# Patient Record
Sex: Female | Born: 1950 | Race: Black or African American | Hispanic: No | Marital: Married | Smoking: Never smoker
Health system: Southern US, Community
[De-identification: ages and names within clinical notes are randomized; demographics above are authoritative.]

## PROBLEM LIST (undated history)

## (undated) DIAGNOSIS — G4700 Insomnia, unspecified: Secondary | ICD-10-CM

## (undated) DIAGNOSIS — M2550 Pain in unspecified joint: Secondary | ICD-10-CM

## (undated) DIAGNOSIS — I10 Essential (primary) hypertension: Secondary | ICD-10-CM

## (undated) DIAGNOSIS — R1313 Dysphagia, pharyngeal phase: Secondary | ICD-10-CM

## (undated) DIAGNOSIS — E876 Hypokalemia: Secondary | ICD-10-CM

## (undated) DIAGNOSIS — J309 Allergic rhinitis, unspecified: Secondary | ICD-10-CM

## (undated) DIAGNOSIS — N3281 Overactive bladder: Secondary | ICD-10-CM

## (undated) DIAGNOSIS — E040 Nontoxic diffuse goiter: Secondary | ICD-10-CM

## (undated) DIAGNOSIS — R413 Other amnesia: Secondary | ICD-10-CM

## (undated) DIAGNOSIS — E785 Hyperlipidemia, unspecified: Secondary | ICD-10-CM

## (undated) HISTORY — DX: Other amnesia: R41.3

## (undated) HISTORY — DX: Allergic rhinitis, unspecified: J30.9

## (undated) HISTORY — DX: Overactive bladder: N32.81

## (undated) HISTORY — DX: Hypokalemia: E87.6

## (undated) HISTORY — DX: Hyperlipidemia, unspecified: E78.5

## (undated) HISTORY — DX: Essential (primary) hypertension: I10

## (undated) HISTORY — DX: Dysphagia, pharyngeal phase: R13.13

## (undated) HISTORY — PX: TOTAL VAGINAL HYSTERECTOMY: SHX2548

## (undated) HISTORY — DX: Nontoxic diffuse goiter: E04.0

## (undated) HISTORY — DX: Pain in unspecified joint: M25.50

## (undated) HISTORY — DX: Insomnia, unspecified: G47.00

---

## 2002-10-25 ENCOUNTER — Ambulatory Visit (HOSPITAL_COMMUNITY): Admission: RE | Admit: 2002-10-25 | Discharge: 2002-10-25 | Payer: Self-pay | Admitting: *Deleted

## 2002-10-25 ENCOUNTER — Encounter: Payer: Self-pay | Admitting: *Deleted

## 2004-06-30 ENCOUNTER — Encounter: Admission: RE | Admit: 2004-06-30 | Discharge: 2004-06-30 | Payer: Self-pay | Admitting: Orthopedic Surgery

## 2004-07-09 ENCOUNTER — Encounter
Admission: RE | Admit: 2004-07-09 | Discharge: 2004-07-09 | Payer: Self-pay | Admitting: Physical Medicine and Rehabilitation

## 2004-12-24 ENCOUNTER — Ambulatory Visit: Payer: Self-pay | Admitting: Psychiatry

## 2004-12-24 ENCOUNTER — Inpatient Hospital Stay (HOSPITAL_COMMUNITY): Admission: RE | Admit: 2004-12-24 | Discharge: 2005-01-03 | Payer: Self-pay | Admitting: Psychiatry

## 2004-12-30 ENCOUNTER — Emergency Department (HOSPITAL_COMMUNITY): Admission: EM | Admit: 2004-12-30 | Discharge: 2004-12-30 | Payer: Self-pay | Admitting: Emergency Medicine

## 2005-01-05 ENCOUNTER — Encounter: Admission: RE | Admit: 2005-01-05 | Discharge: 2005-01-05 | Payer: Self-pay | Admitting: Sports Medicine

## 2005-08-11 ENCOUNTER — Ambulatory Visit (HOSPITAL_BASED_OUTPATIENT_CLINIC_OR_DEPARTMENT_OTHER): Admission: RE | Admit: 2005-08-11 | Discharge: 2005-08-12 | Payer: Self-pay | Admitting: Orthopedic Surgery

## 2006-03-21 IMAGING — CT CT EXTREM LOW W/O CM*R*
4 of 5 series · 12 of 46 positions shown, 19 images · IV contrast (agent unspecified)
Comparison: none

CLINICAL DATA: Chronic right hip pain for several years.  No injury. 
 CT RIGHT HIP W/O CONTRAST: 
 Multidetector helical scans through the right hip were performed.  The dictation from MRI from [facility] was reviewed describing a corticated 8mm right anterior femoral head cystic bony lesion.  On the present study there is a well corticated cystic lesion which is immediately subcortical in the anterior right femoral head at the subcapital line.  This has completely benign characteristics and is most consistent with a simple bone cyst.  No other bony lesion is seen.  The hip joint space appears normal with no significant degenerative change noted.  No acute bony abnormality is seen.  Axial images show no abnormality within the pelvis.  The urinary bladder is decompressed.

[Series 2: pelvis w/o · axial · non-contrast · 0.62mm/px · z∈[-259,-86]mm · 5 of 105 slices shown, 10 images]
[im 18/105  soft-tissue]
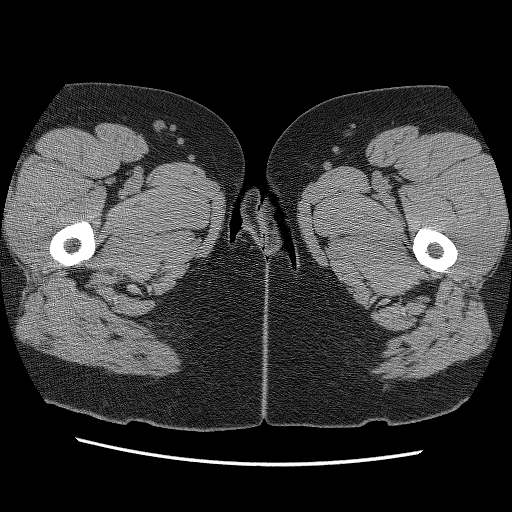
[im 18/105  bone]
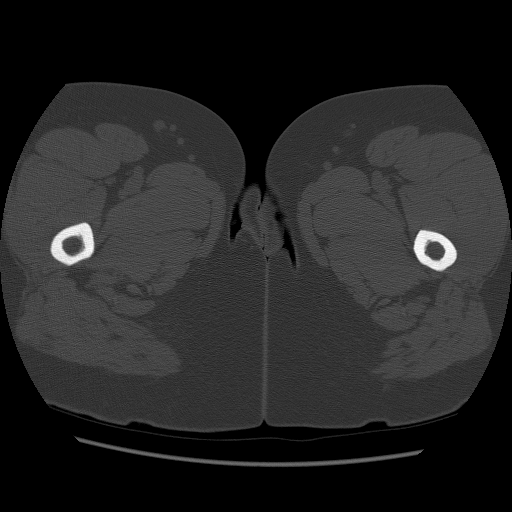
[im 35/105  soft-tissue]
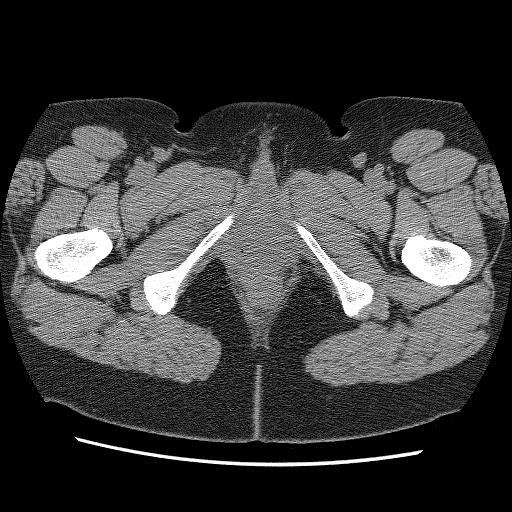
[im 35/105  lung]
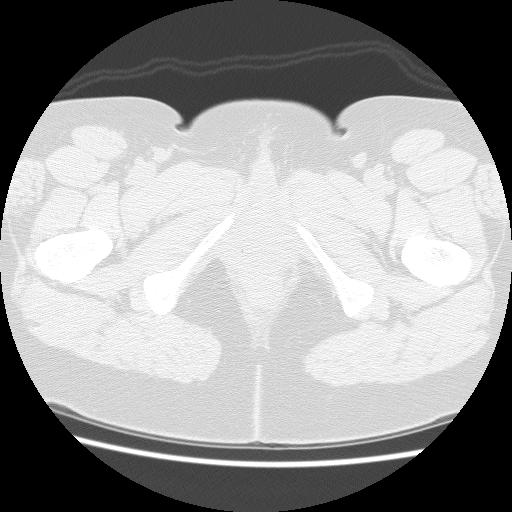
[im 53/105  soft-tissue]
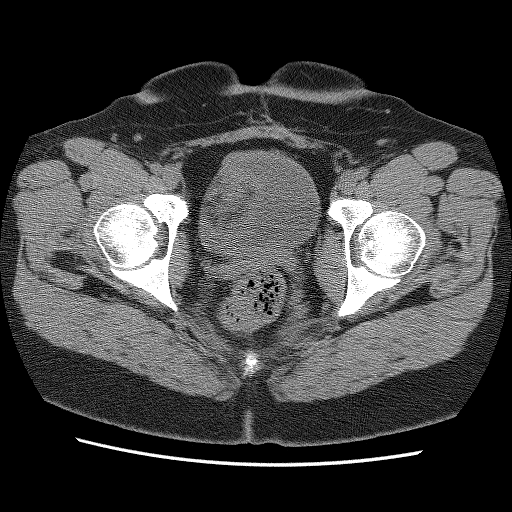
[im 53/105  lung]
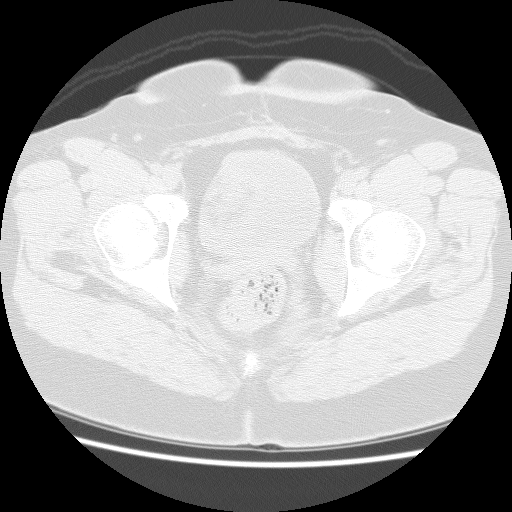
[im 70/105  soft-tissue]
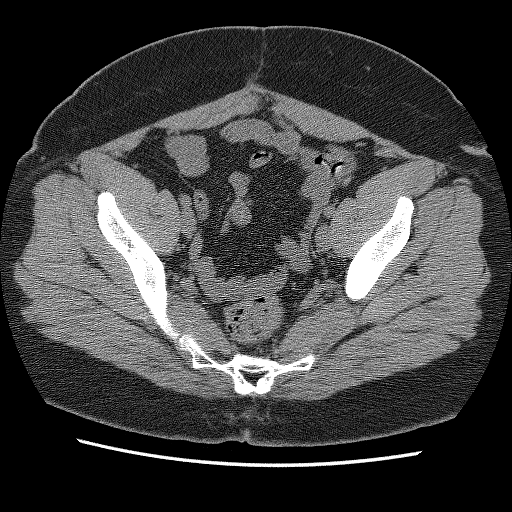
[im 70/105  lung]
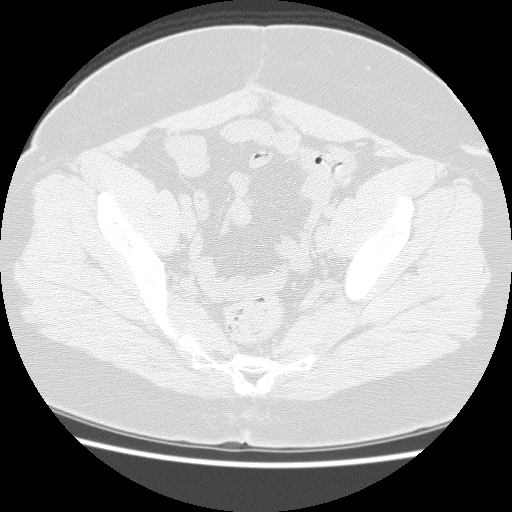
[im 87/105  soft-tissue]
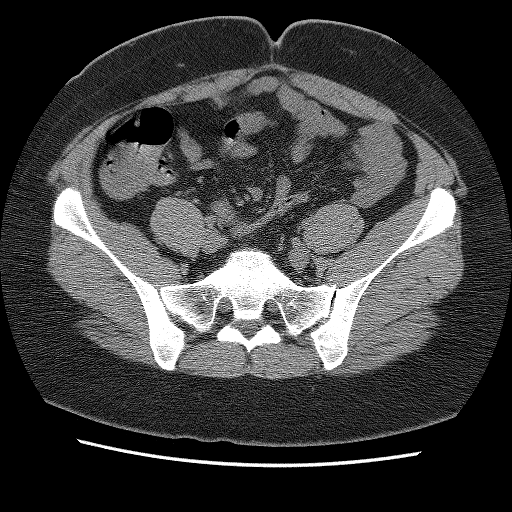
[im 87/105  lung]
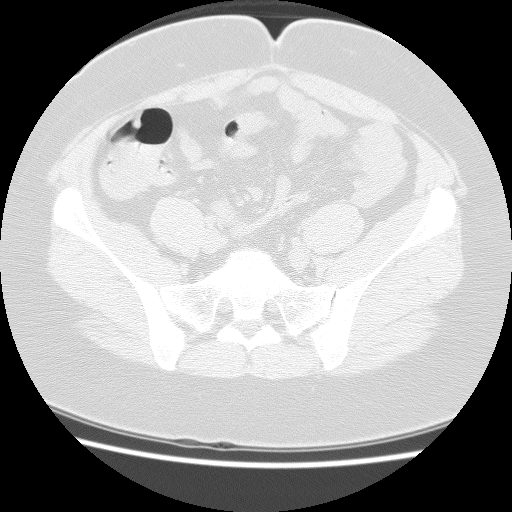

[Series 4: recon 3: pelvis w/o · axial · non-contrast · 0.29mm/px · z∈[-283,-248]mm · 3 of 279 slices shown]
[im 30/279  soft-tissue]
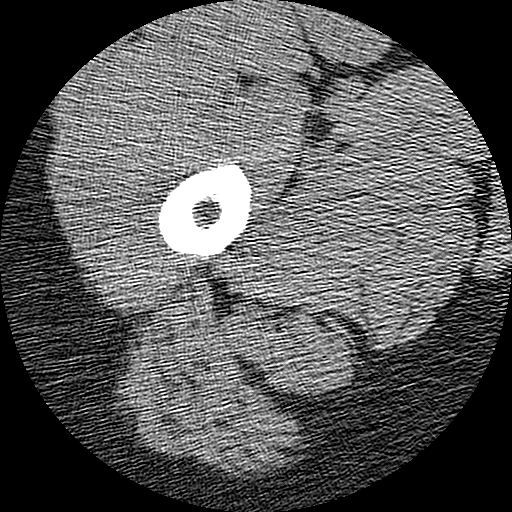
[im 59/279  soft-tissue]
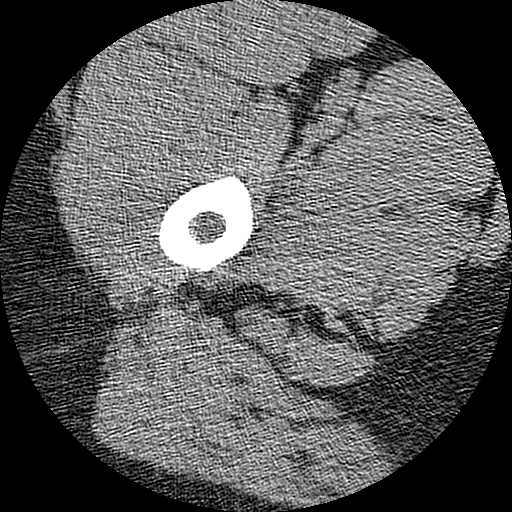
[im 88/279  soft-tissue]
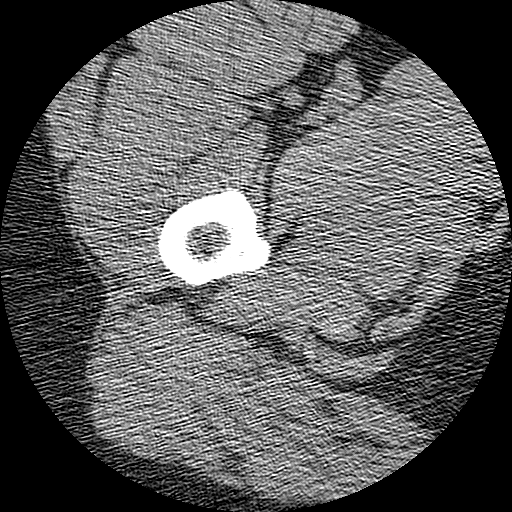

[Series 400: reformatted · sagittal · 0.33mm/px · 3 of 40 slices shown, 4 images (1 of 2)]
[im 14/40  soft-tissue]
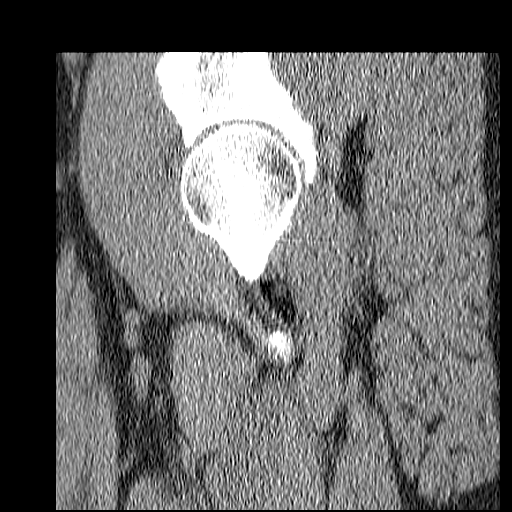
[im 18/40  soft-tissue]
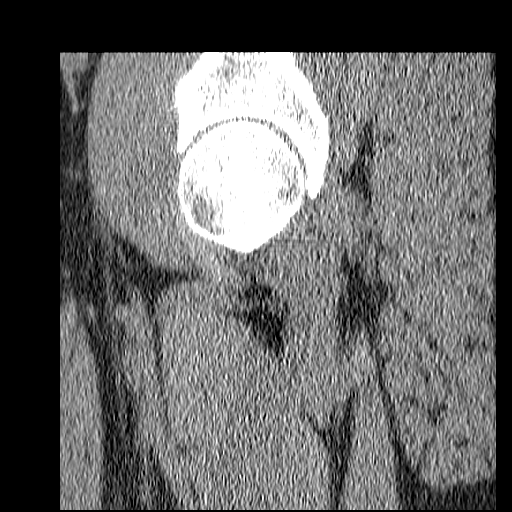
[im 18/40  bone]
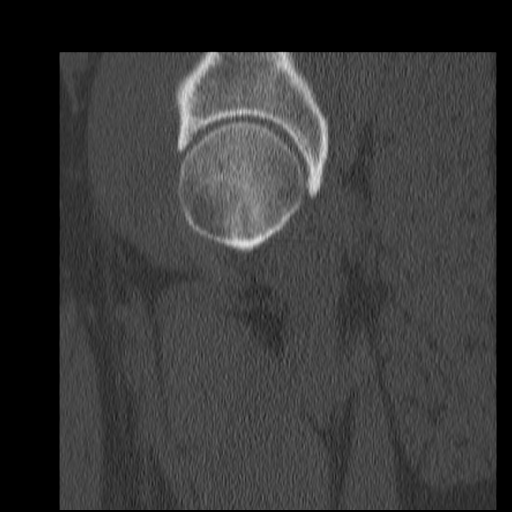
[im 22/40  soft-tissue]
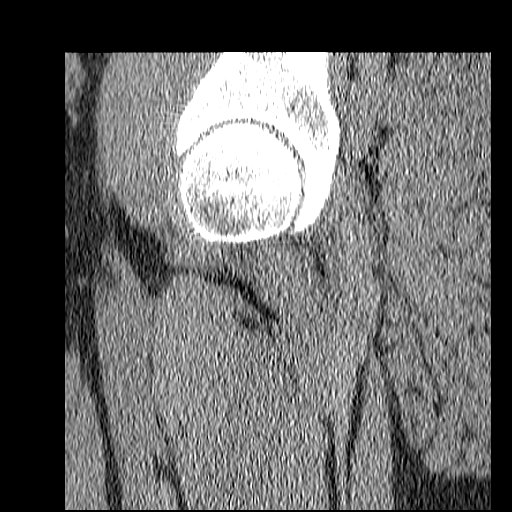

[Series 401: reformatted · coronal · 0.33mm/px · 1 of 40 slices shown, 2 images (2 of 2)]
[im 14/40  soft-tissue]
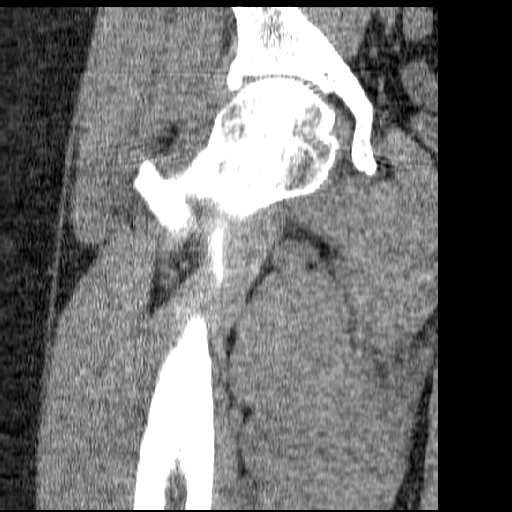
[im 14/40  bone]
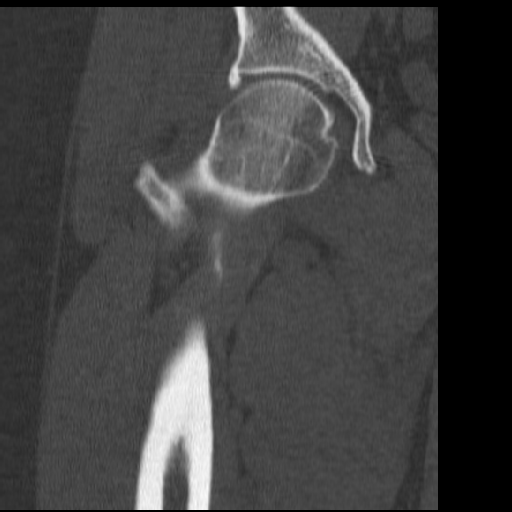

[12 of 46 positions shown; findings below may reference images not displayed]

IMPRESSION: Well corticated 8mm right subcapital femoral cystic lesion consistent with benign process such as simple bone cyst.  No other abnormality is seen.

## 2008-05-15 IMAGING — US US-BREAST([ID])
1 series · 4 of 4 positions shown · non-contrast
Comparison: NONE

CLINICAL DATA: Follow-up mammogram. 

RIGHT BREAST ULTRASOUND

[Series 1: us breast · 0.08mm/px · 4 of 4 slices shown]
[im 1/4]
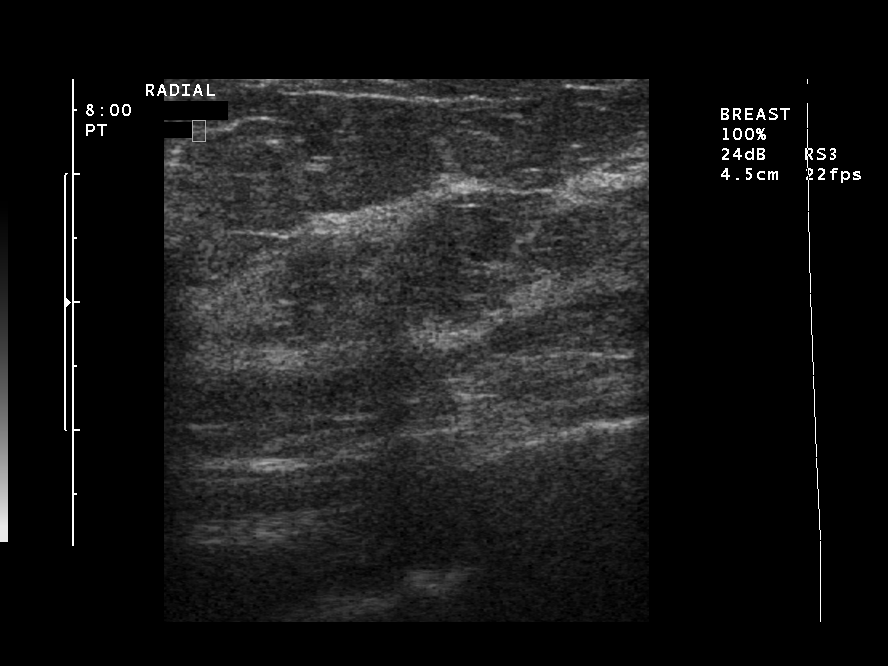
[im 2/4]
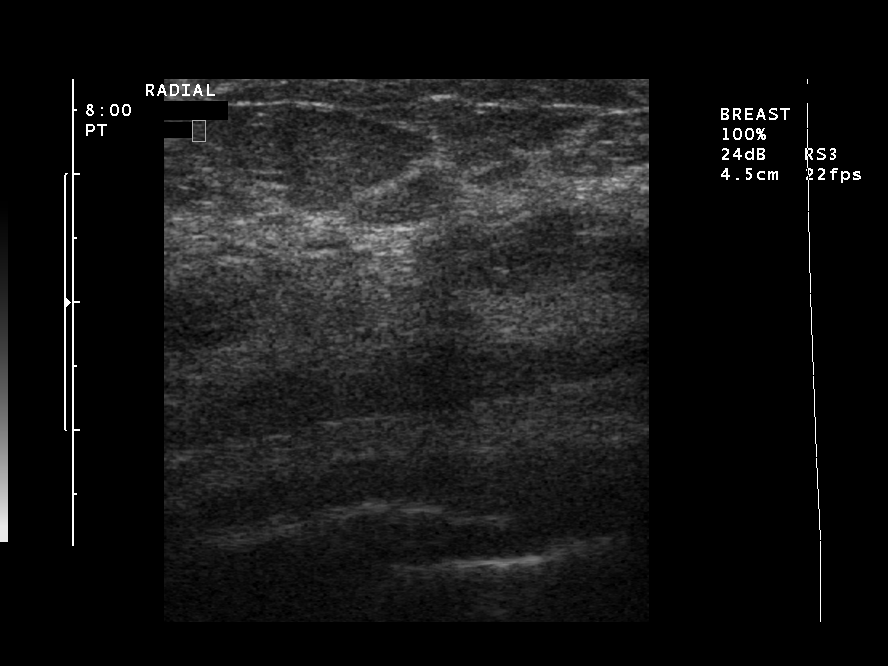
[im 3/4]
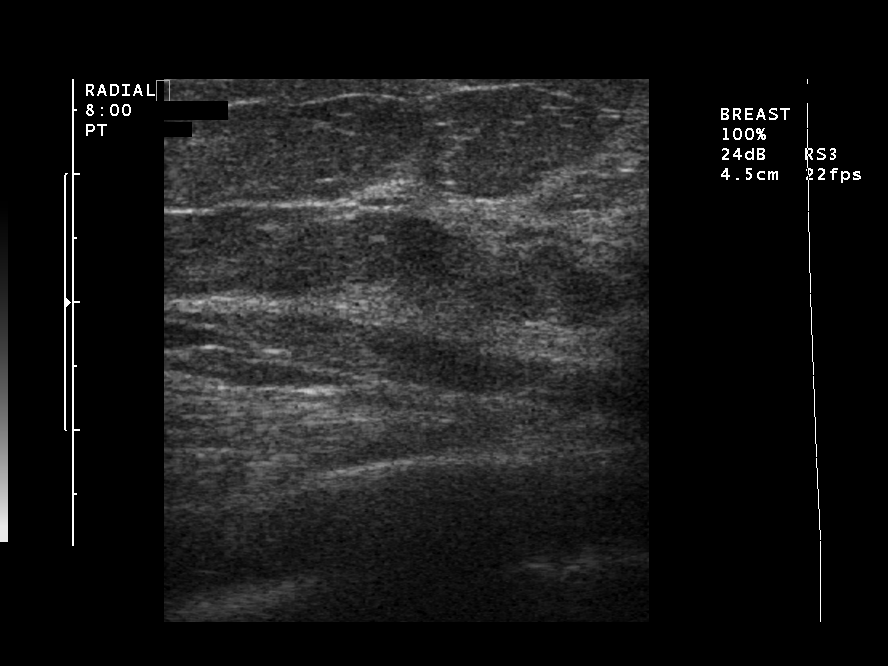
[im 4/4]
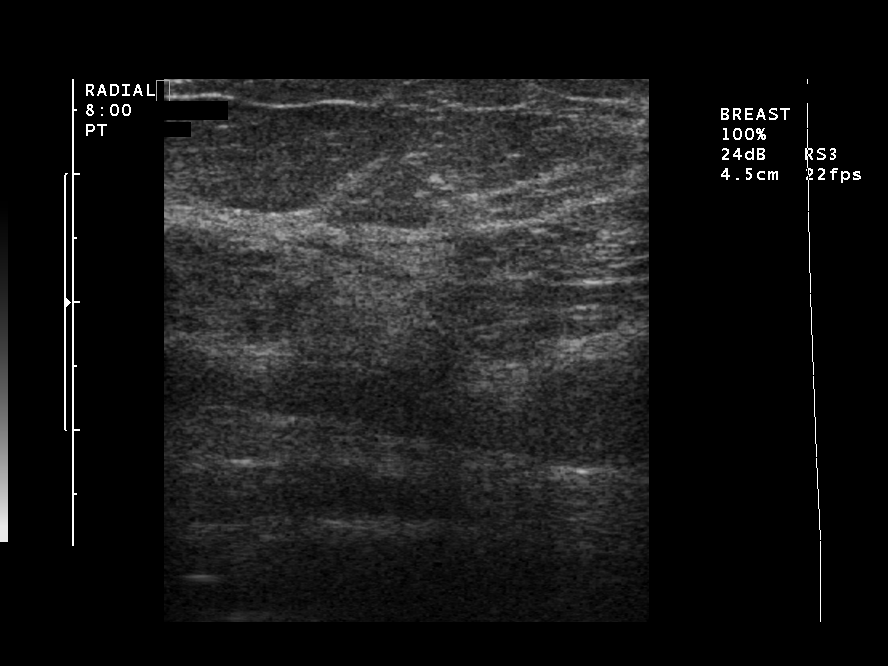

[4 of 4 positions shown; findings below may reference images not displayed]

FINDINGS: The patient reports tenderness and a lump in the right 
breast.  Ultrasound was performed of this region.  This area is 
located at the [DATE] position, 7 cm from the nipple.  There is no 
solid or cystic mass.  No abnormal acoustic attenuation.
IMPRESSION: No ultrasonographic evidence of malignancy. BI-RADS 
03/05/2007 Dict Date: 03/02/2007  Trans Date: 03/05/2007 JH  SHIMPEI

## 2015-10-16 DIAGNOSIS — M545 Low back pain: Secondary | ICD-10-CM | POA: Diagnosis not present

## 2015-10-16 DIAGNOSIS — M25521 Pain in right elbow: Secondary | ICD-10-CM | POA: Diagnosis not present

## 2015-10-23 DIAGNOSIS — Z1231 Encounter for screening mammogram for malignant neoplasm of breast: Secondary | ICD-10-CM | POA: Diagnosis not present

## 2015-11-19 DIAGNOSIS — M79632 Pain in left forearm: Secondary | ICD-10-CM | POA: Diagnosis not present

## 2016-02-12 DIAGNOSIS — J0100 Acute maxillary sinusitis, unspecified: Secondary | ICD-10-CM | POA: Diagnosis not present

## 2016-02-12 DIAGNOSIS — M545 Low back pain: Secondary | ICD-10-CM | POA: Diagnosis not present

## 2016-03-01 DIAGNOSIS — M6281 Muscle weakness (generalized): Secondary | ICD-10-CM | POA: Diagnosis not present

## 2016-03-01 DIAGNOSIS — M545 Low back pain: Secondary | ICD-10-CM | POA: Diagnosis not present

## 2016-03-01 DIAGNOSIS — R2689 Other abnormalities of gait and mobility: Secondary | ICD-10-CM | POA: Diagnosis not present

## 2016-03-10 DIAGNOSIS — M6281 Muscle weakness (generalized): Secondary | ICD-10-CM | POA: Diagnosis not present

## 2016-03-10 DIAGNOSIS — R2689 Other abnormalities of gait and mobility: Secondary | ICD-10-CM | POA: Diagnosis not present

## 2016-03-10 DIAGNOSIS — M545 Low back pain: Secondary | ICD-10-CM | POA: Diagnosis not present

## 2016-03-15 DIAGNOSIS — R2689 Other abnormalities of gait and mobility: Secondary | ICD-10-CM | POA: Diagnosis not present

## 2016-03-15 DIAGNOSIS — M6281 Muscle weakness (generalized): Secondary | ICD-10-CM | POA: Diagnosis not present

## 2016-03-15 DIAGNOSIS — M545 Low back pain: Secondary | ICD-10-CM | POA: Diagnosis not present

## 2016-03-22 DIAGNOSIS — M6281 Muscle weakness (generalized): Secondary | ICD-10-CM | POA: Diagnosis not present

## 2016-03-22 DIAGNOSIS — M545 Low back pain: Secondary | ICD-10-CM | POA: Diagnosis not present

## 2016-03-22 DIAGNOSIS — R2689 Other abnormalities of gait and mobility: Secondary | ICD-10-CM | POA: Diagnosis not present

## 2016-03-29 DIAGNOSIS — R2689 Other abnormalities of gait and mobility: Secondary | ICD-10-CM | POA: Diagnosis not present

## 2016-03-29 DIAGNOSIS — M6281 Muscle weakness (generalized): Secondary | ICD-10-CM | POA: Diagnosis not present

## 2016-03-29 DIAGNOSIS — M545 Low back pain: Secondary | ICD-10-CM | POA: Diagnosis not present

## 2016-04-05 DIAGNOSIS — M545 Low back pain: Secondary | ICD-10-CM | POA: Diagnosis not present

## 2016-04-05 DIAGNOSIS — M6281 Muscle weakness (generalized): Secondary | ICD-10-CM | POA: Diagnosis not present

## 2016-04-05 DIAGNOSIS — R2689 Other abnormalities of gait and mobility: Secondary | ICD-10-CM | POA: Diagnosis not present

## 2016-04-12 DIAGNOSIS — M6281 Muscle weakness (generalized): Secondary | ICD-10-CM | POA: Diagnosis not present

## 2016-04-12 DIAGNOSIS — M545 Low back pain: Secondary | ICD-10-CM | POA: Diagnosis not present

## 2016-04-12 DIAGNOSIS — R2689 Other abnormalities of gait and mobility: Secondary | ICD-10-CM | POA: Diagnosis not present

## 2016-04-19 DIAGNOSIS — M6281 Muscle weakness (generalized): Secondary | ICD-10-CM | POA: Diagnosis not present

## 2016-04-19 DIAGNOSIS — R2689 Other abnormalities of gait and mobility: Secondary | ICD-10-CM | POA: Diagnosis not present

## 2016-04-19 DIAGNOSIS — M545 Low back pain: Secondary | ICD-10-CM | POA: Diagnosis not present

## 2016-05-03 DIAGNOSIS — M6281 Muscle weakness (generalized): Secondary | ICD-10-CM | POA: Diagnosis not present

## 2016-05-03 DIAGNOSIS — M545 Low back pain: Secondary | ICD-10-CM | POA: Diagnosis not present

## 2016-05-03 DIAGNOSIS — R2689 Other abnormalities of gait and mobility: Secondary | ICD-10-CM | POA: Diagnosis not present

## 2016-05-10 DIAGNOSIS — R2689 Other abnormalities of gait and mobility: Secondary | ICD-10-CM | POA: Diagnosis not present

## 2016-05-10 DIAGNOSIS — M545 Low back pain: Secondary | ICD-10-CM | POA: Diagnosis not present

## 2016-05-10 DIAGNOSIS — M6281 Muscle weakness (generalized): Secondary | ICD-10-CM | POA: Diagnosis not present

## 2016-05-17 DIAGNOSIS — M545 Low back pain: Secondary | ICD-10-CM | POA: Diagnosis not present

## 2016-05-17 DIAGNOSIS — M6281 Muscle weakness (generalized): Secondary | ICD-10-CM | POA: Diagnosis not present

## 2016-05-17 DIAGNOSIS — R2689 Other abnormalities of gait and mobility: Secondary | ICD-10-CM | POA: Diagnosis not present

## 2016-05-24 DIAGNOSIS — M6281 Muscle weakness (generalized): Secondary | ICD-10-CM | POA: Diagnosis not present

## 2016-05-24 DIAGNOSIS — R2689 Other abnormalities of gait and mobility: Secondary | ICD-10-CM | POA: Diagnosis not present

## 2016-05-24 DIAGNOSIS — M545 Low back pain: Secondary | ICD-10-CM | POA: Diagnosis not present

## 2016-07-21 DIAGNOSIS — J40 Bronchitis, not specified as acute or chronic: Secondary | ICD-10-CM | POA: Diagnosis not present

## 2016-07-21 DIAGNOSIS — J329 Chronic sinusitis, unspecified: Secondary | ICD-10-CM | POA: Diagnosis not present

## 2016-08-11 DIAGNOSIS — N390 Urinary tract infection, site not specified: Secondary | ICD-10-CM | POA: Diagnosis not present

## 2016-08-11 DIAGNOSIS — Z760 Encounter for issue of repeat prescription: Secondary | ICD-10-CM | POA: Diagnosis not present

## 2016-09-08 DIAGNOSIS — N390 Urinary tract infection, site not specified: Secondary | ICD-10-CM | POA: Diagnosis not present

## 2016-10-11 DIAGNOSIS — R69 Illness, unspecified: Secondary | ICD-10-CM | POA: Diagnosis not present

## 2016-10-27 DIAGNOSIS — K219 Gastro-esophageal reflux disease without esophagitis: Secondary | ICD-10-CM | POA: Diagnosis not present

## 2016-10-27 DIAGNOSIS — I10 Essential (primary) hypertension: Secondary | ICD-10-CM | POA: Diagnosis not present

## 2016-10-27 DIAGNOSIS — M25559 Pain in unspecified hip: Secondary | ICD-10-CM | POA: Diagnosis not present

## 2016-10-27 DIAGNOSIS — Z9181 History of falling: Secondary | ICD-10-CM | POA: Diagnosis not present

## 2016-10-27 DIAGNOSIS — Z0000 Encounter for general adult medical examination without abnormal findings: Secondary | ICD-10-CM | POA: Diagnosis not present

## 2016-10-27 DIAGNOSIS — Z6829 Body mass index (BMI) 29.0-29.9, adult: Secondary | ICD-10-CM | POA: Diagnosis not present

## 2016-10-27 DIAGNOSIS — Z1389 Encounter for screening for other disorder: Secondary | ICD-10-CM | POA: Diagnosis not present

## 2016-11-24 DIAGNOSIS — Z1231 Encounter for screening mammogram for malignant neoplasm of breast: Secondary | ICD-10-CM | POA: Diagnosis not present

## 2017-01-19 DIAGNOSIS — Z23 Encounter for immunization: Secondary | ICD-10-CM | POA: Diagnosis not present

## 2017-01-19 DIAGNOSIS — E876 Hypokalemia: Secondary | ICD-10-CM | POA: Diagnosis not present

## 2017-01-19 DIAGNOSIS — I679 Cerebrovascular disease, unspecified: Secondary | ICD-10-CM | POA: Diagnosis not present

## 2017-01-20 DIAGNOSIS — E039 Hypothyroidism, unspecified: Secondary | ICD-10-CM | POA: Diagnosis not present

## 2017-01-20 DIAGNOSIS — Z79899 Other long term (current) drug therapy: Secondary | ICD-10-CM | POA: Diagnosis not present

## 2017-01-24 DIAGNOSIS — Z1211 Encounter for screening for malignant neoplasm of colon: Secondary | ICD-10-CM | POA: Diagnosis not present

## 2017-02-09 DIAGNOSIS — M545 Low back pain: Secondary | ICD-10-CM | POA: Diagnosis not present

## 2017-02-09 DIAGNOSIS — R2689 Other abnormalities of gait and mobility: Secondary | ICD-10-CM | POA: Diagnosis not present

## 2017-02-09 DIAGNOSIS — M6281 Muscle weakness (generalized): Secondary | ICD-10-CM | POA: Diagnosis not present

## 2017-02-14 DIAGNOSIS — R2689 Other abnormalities of gait and mobility: Secondary | ICD-10-CM | POA: Diagnosis not present

## 2017-02-14 DIAGNOSIS — M545 Low back pain: Secondary | ICD-10-CM | POA: Diagnosis not present

## 2017-02-14 DIAGNOSIS — M6281 Muscle weakness (generalized): Secondary | ICD-10-CM | POA: Diagnosis not present

## 2017-02-16 DIAGNOSIS — R2689 Other abnormalities of gait and mobility: Secondary | ICD-10-CM | POA: Diagnosis not present

## 2017-02-16 DIAGNOSIS — M6281 Muscle weakness (generalized): Secondary | ICD-10-CM | POA: Diagnosis not present

## 2017-02-16 DIAGNOSIS — M545 Low back pain: Secondary | ICD-10-CM | POA: Diagnosis not present

## 2017-02-22 DIAGNOSIS — R2689 Other abnormalities of gait and mobility: Secondary | ICD-10-CM | POA: Diagnosis not present

## 2017-02-22 DIAGNOSIS — M6281 Muscle weakness (generalized): Secondary | ICD-10-CM | POA: Diagnosis not present

## 2017-02-22 DIAGNOSIS — M545 Low back pain: Secondary | ICD-10-CM | POA: Diagnosis not present

## 2017-03-01 DIAGNOSIS — R2689 Other abnormalities of gait and mobility: Secondary | ICD-10-CM | POA: Diagnosis not present

## 2017-03-01 DIAGNOSIS — M6281 Muscle weakness (generalized): Secondary | ICD-10-CM | POA: Diagnosis not present

## 2017-03-01 DIAGNOSIS — M545 Low back pain: Secondary | ICD-10-CM | POA: Diagnosis not present

## 2017-03-02 DIAGNOSIS — M545 Low back pain: Secondary | ICD-10-CM | POA: Diagnosis not present

## 2017-03-02 DIAGNOSIS — M6281 Muscle weakness (generalized): Secondary | ICD-10-CM | POA: Diagnosis not present

## 2017-03-02 DIAGNOSIS — R2689 Other abnormalities of gait and mobility: Secondary | ICD-10-CM | POA: Diagnosis not present

## 2017-03-06 DIAGNOSIS — R2689 Other abnormalities of gait and mobility: Secondary | ICD-10-CM | POA: Diagnosis not present

## 2017-03-06 DIAGNOSIS — M6281 Muscle weakness (generalized): Secondary | ICD-10-CM | POA: Diagnosis not present

## 2017-03-06 DIAGNOSIS — M545 Low back pain: Secondary | ICD-10-CM | POA: Diagnosis not present

## 2017-03-15 DIAGNOSIS — M545 Low back pain: Secondary | ICD-10-CM | POA: Diagnosis not present

## 2017-03-15 DIAGNOSIS — M6281 Muscle weakness (generalized): Secondary | ICD-10-CM | POA: Diagnosis not present

## 2017-03-15 DIAGNOSIS — R2689 Other abnormalities of gait and mobility: Secondary | ICD-10-CM | POA: Diagnosis not present

## 2017-03-16 DIAGNOSIS — M545 Low back pain: Secondary | ICD-10-CM | POA: Diagnosis not present

## 2017-03-16 DIAGNOSIS — R2689 Other abnormalities of gait and mobility: Secondary | ICD-10-CM | POA: Diagnosis not present

## 2017-03-16 DIAGNOSIS — M6281 Muscle weakness (generalized): Secondary | ICD-10-CM | POA: Diagnosis not present

## 2017-03-20 DIAGNOSIS — R2689 Other abnormalities of gait and mobility: Secondary | ICD-10-CM | POA: Diagnosis not present

## 2017-03-20 DIAGNOSIS — M6281 Muscle weakness (generalized): Secondary | ICD-10-CM | POA: Diagnosis not present

## 2017-03-20 DIAGNOSIS — M545 Low back pain: Secondary | ICD-10-CM | POA: Diagnosis not present

## 2017-03-22 DIAGNOSIS — M545 Low back pain: Secondary | ICD-10-CM | POA: Diagnosis not present

## 2017-03-22 DIAGNOSIS — R2689 Other abnormalities of gait and mobility: Secondary | ICD-10-CM | POA: Diagnosis not present

## 2017-03-22 DIAGNOSIS — M6281 Muscle weakness (generalized): Secondary | ICD-10-CM | POA: Diagnosis not present

## 2017-04-10 DIAGNOSIS — L810 Postinflammatory hyperpigmentation: Secondary | ICD-10-CM | POA: Diagnosis not present

## 2017-05-08 DIAGNOSIS — L810 Postinflammatory hyperpigmentation: Secondary | ICD-10-CM | POA: Diagnosis not present

## 2017-05-16 DIAGNOSIS — R69 Illness, unspecified: Secondary | ICD-10-CM | POA: Diagnosis not present

## 2017-06-09 DIAGNOSIS — Z23 Encounter for immunization: Secondary | ICD-10-CM | POA: Diagnosis not present

## 2017-11-22 DIAGNOSIS — H5203 Hypermetropia, bilateral: Secondary | ICD-10-CM | POA: Diagnosis not present

## 2017-11-30 DIAGNOSIS — N3000 Acute cystitis without hematuria: Secondary | ICD-10-CM | POA: Diagnosis not present

## 2017-11-30 DIAGNOSIS — Z6829 Body mass index (BMI) 29.0-29.9, adult: Secondary | ICD-10-CM | POA: Diagnosis not present

## 2017-12-06 DIAGNOSIS — Z1231 Encounter for screening mammogram for malignant neoplasm of breast: Secondary | ICD-10-CM | POA: Diagnosis not present

## 2018-02-28 DIAGNOSIS — J069 Acute upper respiratory infection, unspecified: Secondary | ICD-10-CM | POA: Diagnosis not present

## 2018-03-30 DIAGNOSIS — Z0000 Encounter for general adult medical examination without abnormal findings: Secondary | ICD-10-CM | POA: Diagnosis not present

## 2018-03-30 DIAGNOSIS — R159 Full incontinence of feces: Secondary | ICD-10-CM | POA: Diagnosis not present

## 2018-03-30 DIAGNOSIS — Z6828 Body mass index (BMI) 28.0-28.9, adult: Secondary | ICD-10-CM | POA: Diagnosis not present

## 2018-03-30 DIAGNOSIS — Z1339 Encounter for screening examination for other mental health and behavioral disorders: Secondary | ICD-10-CM | POA: Diagnosis not present

## 2018-03-30 DIAGNOSIS — G4700 Insomnia, unspecified: Secondary | ICD-10-CM | POA: Diagnosis not present

## 2018-03-30 DIAGNOSIS — J309 Allergic rhinitis, unspecified: Secondary | ICD-10-CM | POA: Diagnosis not present

## 2018-03-30 DIAGNOSIS — Z23 Encounter for immunization: Secondary | ICD-10-CM | POA: Diagnosis not present

## 2018-08-23 DIAGNOSIS — S63519A Sprain of carpal joint of unspecified wrist, initial encounter: Secondary | ICD-10-CM | POA: Diagnosis not present

## 2018-08-23 DIAGNOSIS — Z6830 Body mass index (BMI) 30.0-30.9, adult: Secondary | ICD-10-CM | POA: Diagnosis not present

## 2018-08-23 DIAGNOSIS — S63522A Sprain of radiocarpal joint of left wrist, initial encounter: Secondary | ICD-10-CM | POA: Diagnosis not present

## 2018-08-23 DIAGNOSIS — G4700 Insomnia, unspecified: Secondary | ICD-10-CM | POA: Diagnosis not present

## 2018-09-25 DIAGNOSIS — M1812 Unilateral primary osteoarthritis of first carpometacarpal joint, left hand: Secondary | ICD-10-CM | POA: Diagnosis not present

## 2018-09-25 DIAGNOSIS — M79642 Pain in left hand: Secondary | ICD-10-CM | POA: Insufficient documentation

## 2018-09-25 HISTORY — DX: Pain in left hand: M79.642

## 2018-12-10 DIAGNOSIS — Z1231 Encounter for screening mammogram for malignant neoplasm of breast: Secondary | ICD-10-CM | POA: Diagnosis not present

## 2018-12-15 DIAGNOSIS — N3091 Cystitis, unspecified with hematuria: Secondary | ICD-10-CM | POA: Diagnosis not present

## 2018-12-15 DIAGNOSIS — N3001 Acute cystitis with hematuria: Secondary | ICD-10-CM | POA: Diagnosis not present

## 2019-01-24 DIAGNOSIS — H9193 Unspecified hearing loss, bilateral: Secondary | ICD-10-CM | POA: Diagnosis not present

## 2019-01-24 DIAGNOSIS — D229 Melanocytic nevi, unspecified: Secondary | ICD-10-CM | POA: Diagnosis not present

## 2019-01-24 DIAGNOSIS — K219 Gastro-esophageal reflux disease without esophagitis: Secondary | ICD-10-CM | POA: Diagnosis not present

## 2019-01-24 DIAGNOSIS — Z6827 Body mass index (BMI) 27.0-27.9, adult: Secondary | ICD-10-CM | POA: Diagnosis not present

## 2019-01-29 DIAGNOSIS — H25013 Cortical age-related cataract, bilateral: Secondary | ICD-10-CM | POA: Diagnosis not present

## 2019-01-29 DIAGNOSIS — H43812 Vitreous degeneration, left eye: Secondary | ICD-10-CM | POA: Diagnosis not present

## 2019-02-20 DIAGNOSIS — Z20828 Contact with and (suspected) exposure to other viral communicable diseases: Secondary | ICD-10-CM | POA: Diagnosis not present

## 2019-03-12 DIAGNOSIS — H25013 Cortical age-related cataract, bilateral: Secondary | ICD-10-CM | POA: Diagnosis not present

## 2019-03-12 DIAGNOSIS — H18413 Arcus senilis, bilateral: Secondary | ICD-10-CM | POA: Diagnosis not present

## 2019-03-12 DIAGNOSIS — H2512 Age-related nuclear cataract, left eye: Secondary | ICD-10-CM | POA: Diagnosis not present

## 2019-03-12 DIAGNOSIS — H2513 Age-related nuclear cataract, bilateral: Secondary | ICD-10-CM | POA: Diagnosis not present

## 2019-03-12 DIAGNOSIS — H25043 Posterior subcapsular polar age-related cataract, bilateral: Secondary | ICD-10-CM | POA: Diagnosis not present

## 2019-03-15 DIAGNOSIS — H2512 Age-related nuclear cataract, left eye: Secondary | ICD-10-CM | POA: Diagnosis not present

## 2019-03-15 DIAGNOSIS — H2511 Age-related nuclear cataract, right eye: Secondary | ICD-10-CM | POA: Diagnosis not present

## 2019-03-15 DIAGNOSIS — H25012 Cortical age-related cataract, left eye: Secondary | ICD-10-CM | POA: Diagnosis not present

## 2019-04-03 DIAGNOSIS — Z6828 Body mass index (BMI) 28.0-28.9, adult: Secondary | ICD-10-CM | POA: Diagnosis not present

## 2019-04-03 DIAGNOSIS — E039 Hypothyroidism, unspecified: Secondary | ICD-10-CM | POA: Diagnosis not present

## 2019-04-03 DIAGNOSIS — I10 Essential (primary) hypertension: Secondary | ICD-10-CM | POA: Diagnosis not present

## 2019-04-03 DIAGNOSIS — Z0000 Encounter for general adult medical examination without abnormal findings: Secondary | ICD-10-CM | POA: Diagnosis not present

## 2019-04-03 DIAGNOSIS — Z23 Encounter for immunization: Secondary | ICD-10-CM | POA: Diagnosis not present

## 2019-04-03 DIAGNOSIS — N3001 Acute cystitis with hematuria: Secondary | ICD-10-CM | POA: Diagnosis not present

## 2019-04-03 DIAGNOSIS — K219 Gastro-esophageal reflux disease without esophagitis: Secondary | ICD-10-CM | POA: Diagnosis not present

## 2019-04-03 DIAGNOSIS — E785 Hyperlipidemia, unspecified: Secondary | ICD-10-CM | POA: Diagnosis not present

## 2019-04-03 DIAGNOSIS — E669 Obesity, unspecified: Secondary | ICD-10-CM | POA: Diagnosis not present

## 2019-04-12 DIAGNOSIS — H25012 Cortical age-related cataract, left eye: Secondary | ICD-10-CM | POA: Diagnosis not present

## 2019-04-12 DIAGNOSIS — H2511 Age-related nuclear cataract, right eye: Secondary | ICD-10-CM | POA: Diagnosis not present

## 2019-04-12 DIAGNOSIS — H25011 Cortical age-related cataract, right eye: Secondary | ICD-10-CM | POA: Diagnosis not present

## 2019-05-01 DIAGNOSIS — Z20828 Contact with and (suspected) exposure to other viral communicable diseases: Secondary | ICD-10-CM | POA: Diagnosis not present

## 2019-05-07 DIAGNOSIS — Z9181 History of falling: Secondary | ICD-10-CM | POA: Diagnosis not present

## 2019-05-07 DIAGNOSIS — Z6828 Body mass index (BMI) 28.0-28.9, adult: Secondary | ICD-10-CM | POA: Diagnosis not present

## 2019-05-07 DIAGNOSIS — Z1272 Encounter for screening for malignant neoplasm of vagina: Secondary | ICD-10-CM | POA: Diagnosis not present

## 2019-05-07 DIAGNOSIS — N3001 Acute cystitis with hematuria: Secondary | ICD-10-CM | POA: Diagnosis not present

## 2019-05-30 DIAGNOSIS — Z20828 Contact with and (suspected) exposure to other viral communicable diseases: Secondary | ICD-10-CM | POA: Diagnosis not present

## 2019-08-12 DIAGNOSIS — D225 Melanocytic nevi of trunk: Secondary | ICD-10-CM | POA: Diagnosis not present

## 2019-08-12 DIAGNOSIS — L821 Other seborrheic keratosis: Secondary | ICD-10-CM | POA: Diagnosis not present

## 2019-09-24 DIAGNOSIS — L650 Telogen effluvium: Secondary | ICD-10-CM | POA: Diagnosis not present

## 2019-09-24 DIAGNOSIS — L648 Other androgenic alopecia: Secondary | ICD-10-CM | POA: Diagnosis not present

## 2019-10-10 DIAGNOSIS — G4700 Insomnia, unspecified: Secondary | ICD-10-CM | POA: Diagnosis not present

## 2019-10-10 DIAGNOSIS — N61 Mastitis without abscess: Secondary | ICD-10-CM | POA: Diagnosis not present

## 2019-10-10 DIAGNOSIS — I10 Essential (primary) hypertension: Secondary | ICD-10-CM | POA: Diagnosis not present

## 2019-10-10 DIAGNOSIS — E669 Obesity, unspecified: Secondary | ICD-10-CM | POA: Diagnosis not present

## 2019-10-10 DIAGNOSIS — Z6827 Body mass index (BMI) 27.0-27.9, adult: Secondary | ICD-10-CM | POA: Diagnosis not present

## 2019-10-10 DIAGNOSIS — N3281 Overactive bladder: Secondary | ICD-10-CM | POA: Diagnosis not present

## 2019-10-10 DIAGNOSIS — R0789 Other chest pain: Secondary | ICD-10-CM | POA: Diagnosis not present

## 2019-10-10 DIAGNOSIS — J309 Allergic rhinitis, unspecified: Secondary | ICD-10-CM | POA: Diagnosis not present

## 2019-10-24 DIAGNOSIS — Z6828 Body mass index (BMI) 28.0-28.9, adult: Secondary | ICD-10-CM | POA: Diagnosis not present

## 2019-10-24 DIAGNOSIS — Z79899 Other long term (current) drug therapy: Secondary | ICD-10-CM | POA: Diagnosis not present

## 2019-10-24 DIAGNOSIS — I10 Essential (primary) hypertension: Secondary | ICD-10-CM | POA: Diagnosis not present

## 2019-10-24 DIAGNOSIS — D649 Anemia, unspecified: Secondary | ICD-10-CM | POA: Diagnosis not present

## 2019-10-24 DIAGNOSIS — N644 Mastodynia: Secondary | ICD-10-CM | POA: Diagnosis not present

## 2019-10-24 DIAGNOSIS — L659 Nonscarring hair loss, unspecified: Secondary | ICD-10-CM | POA: Diagnosis not present

## 2019-10-24 DIAGNOSIS — N61 Mastitis without abscess: Secondary | ICD-10-CM | POA: Diagnosis not present

## 2019-10-24 DIAGNOSIS — E559 Vitamin D deficiency, unspecified: Secondary | ICD-10-CM | POA: Diagnosis not present

## 2019-10-30 DIAGNOSIS — N6489 Other specified disorders of breast: Secondary | ICD-10-CM | POA: Diagnosis not present

## 2019-10-30 DIAGNOSIS — R922 Inconclusive mammogram: Secondary | ICD-10-CM | POA: Diagnosis not present

## 2019-10-30 DIAGNOSIS — N6322 Unspecified lump in the left breast, upper inner quadrant: Secondary | ICD-10-CM | POA: Diagnosis not present

## 2019-11-06 DIAGNOSIS — N6322 Unspecified lump in the left breast, upper inner quadrant: Secondary | ICD-10-CM | POA: Diagnosis not present

## 2019-12-04 DIAGNOSIS — K5730 Diverticulosis of large intestine without perforation or abscess without bleeding: Secondary | ICD-10-CM | POA: Diagnosis not present

## 2019-12-04 DIAGNOSIS — R1013 Epigastric pain: Secondary | ICD-10-CM | POA: Diagnosis not present

## 2019-12-04 DIAGNOSIS — D509 Iron deficiency anemia, unspecified: Secondary | ICD-10-CM | POA: Diagnosis not present

## 2019-12-04 DIAGNOSIS — K5900 Constipation, unspecified: Secondary | ICD-10-CM | POA: Diagnosis not present

## 2019-12-23 DIAGNOSIS — Z1159 Encounter for screening for other viral diseases: Secondary | ICD-10-CM | POA: Diagnosis not present

## 2019-12-23 DIAGNOSIS — Z20828 Contact with and (suspected) exposure to other viral communicable diseases: Secondary | ICD-10-CM | POA: Diagnosis not present

## 2019-12-27 DIAGNOSIS — Z1159 Encounter for screening for other viral diseases: Secondary | ICD-10-CM | POA: Diagnosis not present

## 2020-01-03 DIAGNOSIS — K219 Gastro-esophageal reflux disease without esophagitis: Secondary | ICD-10-CM | POA: Diagnosis not present

## 2020-01-03 DIAGNOSIS — K648 Other hemorrhoids: Secondary | ICD-10-CM | POA: Diagnosis not present

## 2020-01-03 DIAGNOSIS — D500 Iron deficiency anemia secondary to blood loss (chronic): Secondary | ICD-10-CM | POA: Diagnosis not present

## 2020-01-03 DIAGNOSIS — D509 Iron deficiency anemia, unspecified: Secondary | ICD-10-CM | POA: Diagnosis not present

## 2020-01-03 DIAGNOSIS — K5730 Diverticulosis of large intestine without perforation or abscess without bleeding: Secondary | ICD-10-CM | POA: Diagnosis not present

## 2020-01-03 DIAGNOSIS — Z1211 Encounter for screening for malignant neoplasm of colon: Secondary | ICD-10-CM | POA: Diagnosis not present

## 2020-01-03 DIAGNOSIS — K209 Esophagitis, unspecified without bleeding: Secondary | ICD-10-CM | POA: Diagnosis not present

## 2020-01-03 DIAGNOSIS — K644 Residual hemorrhoidal skin tags: Secondary | ICD-10-CM | POA: Diagnosis not present

## 2020-01-03 DIAGNOSIS — K449 Diaphragmatic hernia without obstruction or gangrene: Secondary | ICD-10-CM | POA: Diagnosis not present

## 2020-01-03 DIAGNOSIS — I10 Essential (primary) hypertension: Secondary | ICD-10-CM | POA: Diagnosis not present

## 2020-01-15 DIAGNOSIS — N3001 Acute cystitis with hematuria: Secondary | ICD-10-CM | POA: Diagnosis not present

## 2020-01-15 DIAGNOSIS — Z6827 Body mass index (BMI) 27.0-27.9, adult: Secondary | ICD-10-CM | POA: Diagnosis not present

## 2020-01-16 DIAGNOSIS — N3001 Acute cystitis with hematuria: Secondary | ICD-10-CM | POA: Diagnosis not present

## 2020-01-30 DIAGNOSIS — Z6827 Body mass index (BMI) 27.0-27.9, adult: Secondary | ICD-10-CM | POA: Diagnosis not present

## 2020-01-30 DIAGNOSIS — N3000 Acute cystitis without hematuria: Secondary | ICD-10-CM | POA: Diagnosis not present

## 2020-02-07 DIAGNOSIS — L648 Other androgenic alopecia: Secondary | ICD-10-CM | POA: Diagnosis not present

## 2020-02-07 DIAGNOSIS — I6509 Occlusion and stenosis of unspecified vertebral artery: Secondary | ICD-10-CM | POA: Diagnosis not present

## 2020-02-07 DIAGNOSIS — Z79899 Other long term (current) drug therapy: Secondary | ICD-10-CM | POA: Diagnosis not present

## 2020-02-12 DIAGNOSIS — K5900 Constipation, unspecified: Secondary | ICD-10-CM | POA: Diagnosis not present

## 2020-02-12 DIAGNOSIS — K219 Gastro-esophageal reflux disease without esophagitis: Secondary | ICD-10-CM | POA: Diagnosis not present

## 2020-02-18 DIAGNOSIS — R922 Inconclusive mammogram: Secondary | ICD-10-CM | POA: Diagnosis not present

## 2020-02-28 DIAGNOSIS — Z6827 Body mass index (BMI) 27.0-27.9, adult: Secondary | ICD-10-CM | POA: Diagnosis not present

## 2020-02-28 DIAGNOSIS — N3000 Acute cystitis without hematuria: Secondary | ICD-10-CM | POA: Diagnosis not present

## 2020-03-17 DIAGNOSIS — M545 Low back pain: Secondary | ICD-10-CM | POA: Diagnosis not present

## 2020-03-17 DIAGNOSIS — Z6827 Body mass index (BMI) 27.0-27.9, adult: Secondary | ICD-10-CM | POA: Diagnosis not present

## 2020-03-17 DIAGNOSIS — N3000 Acute cystitis without hematuria: Secondary | ICD-10-CM | POA: Diagnosis not present

## 2020-03-25 DIAGNOSIS — M545 Low back pain: Secondary | ICD-10-CM | POA: Diagnosis not present

## 2020-03-25 DIAGNOSIS — M6281 Muscle weakness (generalized): Secondary | ICD-10-CM | POA: Diagnosis not present

## 2020-04-01 DIAGNOSIS — M6281 Muscle weakness (generalized): Secondary | ICD-10-CM | POA: Diagnosis not present

## 2020-04-01 DIAGNOSIS — M545 Low back pain, unspecified: Secondary | ICD-10-CM | POA: Diagnosis not present

## 2020-04-06 DIAGNOSIS — M6281 Muscle weakness (generalized): Secondary | ICD-10-CM | POA: Diagnosis not present

## 2020-04-06 DIAGNOSIS — M545 Low back pain, unspecified: Secondary | ICD-10-CM | POA: Diagnosis not present

## 2020-04-08 DIAGNOSIS — Z79899 Other long term (current) drug therapy: Secondary | ICD-10-CM | POA: Diagnosis not present

## 2020-04-08 DIAGNOSIS — M6281 Muscle weakness (generalized): Secondary | ICD-10-CM | POA: Diagnosis not present

## 2020-04-08 DIAGNOSIS — Z23 Encounter for immunization: Secondary | ICD-10-CM | POA: Diagnosis not present

## 2020-04-08 DIAGNOSIS — Z6828 Body mass index (BMI) 28.0-28.9, adult: Secondary | ICD-10-CM | POA: Diagnosis not present

## 2020-04-08 DIAGNOSIS — Z13820 Encounter for screening for osteoporosis: Secondary | ICD-10-CM | POA: Diagnosis not present

## 2020-04-08 DIAGNOSIS — Z0000 Encounter for general adult medical examination without abnormal findings: Secondary | ICD-10-CM | POA: Diagnosis not present

## 2020-04-08 DIAGNOSIS — M545 Low back pain, unspecified: Secondary | ICD-10-CM | POA: Diagnosis not present

## 2020-04-08 DIAGNOSIS — M5441 Lumbago with sciatica, right side: Secondary | ICD-10-CM | POA: Diagnosis not present

## 2020-04-08 DIAGNOSIS — I10 Essential (primary) hypertension: Secondary | ICD-10-CM | POA: Diagnosis not present

## 2020-04-08 DIAGNOSIS — E785 Hyperlipidemia, unspecified: Secondary | ICD-10-CM | POA: Diagnosis not present

## 2020-04-08 DIAGNOSIS — R413 Other amnesia: Secondary | ICD-10-CM | POA: Diagnosis not present

## 2020-04-13 DIAGNOSIS — M6281 Muscle weakness (generalized): Secondary | ICD-10-CM | POA: Diagnosis not present

## 2020-04-13 DIAGNOSIS — M545 Low back pain, unspecified: Secondary | ICD-10-CM | POA: Diagnosis not present

## 2020-04-15 DIAGNOSIS — M6281 Muscle weakness (generalized): Secondary | ICD-10-CM | POA: Diagnosis not present

## 2020-04-15 DIAGNOSIS — M545 Low back pain, unspecified: Secondary | ICD-10-CM | POA: Diagnosis not present

## 2020-04-20 DIAGNOSIS — M6281 Muscle weakness (generalized): Secondary | ICD-10-CM | POA: Diagnosis not present

## 2020-04-20 DIAGNOSIS — M545 Low back pain, unspecified: Secondary | ICD-10-CM | POA: Diagnosis not present

## 2020-04-22 DIAGNOSIS — M545 Low back pain, unspecified: Secondary | ICD-10-CM | POA: Diagnosis not present

## 2020-04-22 DIAGNOSIS — M6281 Muscle weakness (generalized): Secondary | ICD-10-CM | POA: Diagnosis not present

## 2020-04-27 DIAGNOSIS — M6281 Muscle weakness (generalized): Secondary | ICD-10-CM | POA: Diagnosis not present

## 2020-04-27 DIAGNOSIS — M545 Low back pain, unspecified: Secondary | ICD-10-CM | POA: Diagnosis not present

## 2020-04-29 DIAGNOSIS — M545 Low back pain, unspecified: Secondary | ICD-10-CM | POA: Diagnosis not present

## 2020-04-29 DIAGNOSIS — M6281 Muscle weakness (generalized): Secondary | ICD-10-CM | POA: Diagnosis not present

## 2020-05-04 DIAGNOSIS — M6281 Muscle weakness (generalized): Secondary | ICD-10-CM | POA: Diagnosis not present

## 2020-05-04 DIAGNOSIS — M545 Low back pain, unspecified: Secondary | ICD-10-CM | POA: Diagnosis not present

## 2020-05-06 DIAGNOSIS — M545 Low back pain, unspecified: Secondary | ICD-10-CM | POA: Diagnosis not present

## 2020-05-06 DIAGNOSIS — M6281 Muscle weakness (generalized): Secondary | ICD-10-CM | POA: Diagnosis not present

## 2020-05-07 DIAGNOSIS — M545 Low back pain, unspecified: Secondary | ICD-10-CM | POA: Diagnosis not present

## 2020-05-07 DIAGNOSIS — M25561 Pain in right knee: Secondary | ICD-10-CM | POA: Diagnosis not present

## 2020-05-07 DIAGNOSIS — M533 Sacrococcygeal disorders, not elsewhere classified: Secondary | ICD-10-CM | POA: Diagnosis not present

## 2020-05-18 DIAGNOSIS — M545 Low back pain, unspecified: Secondary | ICD-10-CM | POA: Diagnosis not present

## 2020-05-18 DIAGNOSIS — M6281 Muscle weakness (generalized): Secondary | ICD-10-CM | POA: Diagnosis not present

## 2020-05-26 DIAGNOSIS — M5441 Lumbago with sciatica, right side: Secondary | ICD-10-CM | POA: Diagnosis not present

## 2020-05-26 DIAGNOSIS — I10 Essential (primary) hypertension: Secondary | ICD-10-CM | POA: Diagnosis not present

## 2020-05-26 DIAGNOSIS — Z6828 Body mass index (BMI) 28.0-28.9, adult: Secondary | ICD-10-CM | POA: Diagnosis not present

## 2020-05-26 DIAGNOSIS — Z9181 History of falling: Secondary | ICD-10-CM | POA: Diagnosis not present

## 2020-05-26 DIAGNOSIS — R413 Other amnesia: Secondary | ICD-10-CM | POA: Diagnosis not present

## 2020-06-11 DIAGNOSIS — R070 Pain in throat: Secondary | ICD-10-CM | POA: Diagnosis not present

## 2020-06-11 DIAGNOSIS — R5383 Other fatigue: Secondary | ICD-10-CM | POA: Diagnosis not present

## 2020-06-11 DIAGNOSIS — J209 Acute bronchitis, unspecified: Secondary | ICD-10-CM | POA: Diagnosis not present

## 2020-06-11 DIAGNOSIS — R0981 Nasal congestion: Secondary | ICD-10-CM | POA: Diagnosis not present

## 2020-06-11 DIAGNOSIS — Z20828 Contact with and (suspected) exposure to other viral communicable diseases: Secondary | ICD-10-CM | POA: Diagnosis not present

## 2020-06-11 DIAGNOSIS — R051 Acute cough: Secondary | ICD-10-CM | POA: Diagnosis not present

## 2020-06-11 DIAGNOSIS — M791 Myalgia, unspecified site: Secondary | ICD-10-CM | POA: Diagnosis not present

## 2020-07-11 DIAGNOSIS — Z20828 Contact with and (suspected) exposure to other viral communicable diseases: Secondary | ICD-10-CM | POA: Diagnosis not present

## 2020-07-22 DIAGNOSIS — R051 Acute cough: Secondary | ICD-10-CM | POA: Diagnosis not present

## 2020-07-22 DIAGNOSIS — U071 COVID-19: Secondary | ICD-10-CM | POA: Diagnosis not present

## 2020-08-25 DIAGNOSIS — M533 Sacrococcygeal disorders, not elsewhere classified: Secondary | ICD-10-CM | POA: Diagnosis not present

## 2020-08-25 DIAGNOSIS — M25561 Pain in right knee: Secondary | ICD-10-CM | POA: Diagnosis not present

## 2020-08-25 DIAGNOSIS — M545 Low back pain, unspecified: Secondary | ICD-10-CM | POA: Diagnosis not present

## 2020-08-31 DIAGNOSIS — M545 Low back pain, unspecified: Secondary | ICD-10-CM | POA: Diagnosis not present

## 2020-08-31 DIAGNOSIS — M6281 Muscle weakness (generalized): Secondary | ICD-10-CM | POA: Diagnosis not present

## 2020-09-02 DIAGNOSIS — M6281 Muscle weakness (generalized): Secondary | ICD-10-CM | POA: Diagnosis not present

## 2020-09-02 DIAGNOSIS — M545 Low back pain, unspecified: Secondary | ICD-10-CM | POA: Diagnosis not present

## 2020-09-08 DIAGNOSIS — M6281 Muscle weakness (generalized): Secondary | ICD-10-CM | POA: Diagnosis not present

## 2020-09-08 DIAGNOSIS — M545 Low back pain, unspecified: Secondary | ICD-10-CM | POA: Diagnosis not present

## 2020-09-10 DIAGNOSIS — M545 Low back pain, unspecified: Secondary | ICD-10-CM | POA: Diagnosis not present

## 2020-09-10 DIAGNOSIS — M6281 Muscle weakness (generalized): Secondary | ICD-10-CM | POA: Diagnosis not present

## 2020-09-15 DIAGNOSIS — M6281 Muscle weakness (generalized): Secondary | ICD-10-CM | POA: Diagnosis not present

## 2020-09-15 DIAGNOSIS — M545 Low back pain, unspecified: Secondary | ICD-10-CM | POA: Diagnosis not present

## 2020-09-22 DIAGNOSIS — M545 Low back pain, unspecified: Secondary | ICD-10-CM | POA: Diagnosis not present

## 2020-09-22 DIAGNOSIS — M6281 Muscle weakness (generalized): Secondary | ICD-10-CM | POA: Diagnosis not present

## 2020-10-21 DIAGNOSIS — R413 Other amnesia: Secondary | ICD-10-CM | POA: Diagnosis not present

## 2020-10-21 DIAGNOSIS — E876 Hypokalemia: Secondary | ICD-10-CM | POA: Diagnosis not present

## 2020-10-21 DIAGNOSIS — Z9181 History of falling: Secondary | ICD-10-CM | POA: Diagnosis not present

## 2020-10-21 DIAGNOSIS — Z6828 Body mass index (BMI) 28.0-28.9, adult: Secondary | ICD-10-CM | POA: Diagnosis not present

## 2020-10-21 DIAGNOSIS — I10 Essential (primary) hypertension: Secondary | ICD-10-CM | POA: Diagnosis not present

## 2020-11-02 DIAGNOSIS — Z1231 Encounter for screening mammogram for malignant neoplasm of breast: Secondary | ICD-10-CM | POA: Diagnosis not present

## 2020-11-24 DIAGNOSIS — Z6827 Body mass index (BMI) 27.0-27.9, adult: Secondary | ICD-10-CM | POA: Diagnosis not present

## 2020-11-24 DIAGNOSIS — R413 Other amnesia: Secondary | ICD-10-CM | POA: Diagnosis not present

## 2020-11-24 DIAGNOSIS — E876 Hypokalemia: Secondary | ICD-10-CM | POA: Diagnosis not present

## 2020-12-08 DIAGNOSIS — R413 Other amnesia: Secondary | ICD-10-CM | POA: Diagnosis not present

## 2020-12-08 DIAGNOSIS — Z6829 Body mass index (BMI) 29.0-29.9, adult: Secondary | ICD-10-CM | POA: Diagnosis not present

## 2020-12-08 DIAGNOSIS — E876 Hypokalemia: Secondary | ICD-10-CM | POA: Diagnosis not present

## 2021-01-01 DIAGNOSIS — Z5321 Procedure and treatment not carried out due to patient leaving prior to being seen by health care provider: Secondary | ICD-10-CM | POA: Diagnosis not present

## 2021-01-01 DIAGNOSIS — X58XXXA Exposure to other specified factors, initial encounter: Secondary | ICD-10-CM | POA: Diagnosis not present

## 2021-01-01 DIAGNOSIS — R079 Chest pain, unspecified: Secondary | ICD-10-CM | POA: Diagnosis not present

## 2021-01-01 DIAGNOSIS — M546 Pain in thoracic spine: Secondary | ICD-10-CM | POA: Diagnosis not present

## 2021-01-01 DIAGNOSIS — Z6828 Body mass index (BMI) 28.0-28.9, adult: Secondary | ICD-10-CM | POA: Diagnosis not present

## 2021-01-01 DIAGNOSIS — S29019A Strain of muscle and tendon of unspecified wall of thorax, initial encounter: Secondary | ICD-10-CM | POA: Diagnosis not present

## 2021-01-01 DIAGNOSIS — M47814 Spondylosis without myelopathy or radiculopathy, thoracic region: Secondary | ICD-10-CM | POA: Diagnosis not present

## 2021-01-01 DIAGNOSIS — R918 Other nonspecific abnormal finding of lung field: Secondary | ICD-10-CM | POA: Diagnosis not present

## 2021-01-01 DIAGNOSIS — Y998 Other external cause status: Secondary | ICD-10-CM | POA: Diagnosis not present

## 2021-01-01 DIAGNOSIS — E876 Hypokalemia: Secondary | ICD-10-CM | POA: Diagnosis not present

## 2021-01-01 DIAGNOSIS — R0602 Shortness of breath: Secondary | ICD-10-CM | POA: Diagnosis not present

## 2021-01-01 DIAGNOSIS — M549 Dorsalgia, unspecified: Secondary | ICD-10-CM | POA: Diagnosis not present

## 2021-01-03 DIAGNOSIS — M546 Pain in thoracic spine: Secondary | ICD-10-CM | POA: Diagnosis not present

## 2021-01-04 DIAGNOSIS — R9431 Abnormal electrocardiogram [ECG] [EKG]: Secondary | ICD-10-CM | POA: Diagnosis not present

## 2021-01-15 DIAGNOSIS — M533 Sacrococcygeal disorders, not elsewhere classified: Secondary | ICD-10-CM | POA: Diagnosis not present

## 2021-01-15 DIAGNOSIS — M542 Cervicalgia: Secondary | ICD-10-CM | POA: Diagnosis not present

## 2021-01-15 DIAGNOSIS — M545 Low back pain, unspecified: Secondary | ICD-10-CM | POA: Diagnosis not present

## 2021-01-15 DIAGNOSIS — M25512 Pain in left shoulder: Secondary | ICD-10-CM | POA: Diagnosis not present

## 2021-01-19 DIAGNOSIS — I10 Essential (primary) hypertension: Secondary | ICD-10-CM | POA: Diagnosis not present

## 2021-01-19 DIAGNOSIS — R413 Other amnesia: Secondary | ICD-10-CM | POA: Diagnosis not present

## 2021-01-19 DIAGNOSIS — E876 Hypokalemia: Secondary | ICD-10-CM | POA: Diagnosis not present

## 2021-01-19 DIAGNOSIS — Z6828 Body mass index (BMI) 28.0-28.9, adult: Secondary | ICD-10-CM | POA: Diagnosis not present

## 2021-01-19 DIAGNOSIS — M546 Pain in thoracic spine: Secondary | ICD-10-CM | POA: Diagnosis not present

## 2021-03-26 DIAGNOSIS — M79645 Pain in left finger(s): Secondary | ICD-10-CM | POA: Diagnosis not present

## 2021-03-26 DIAGNOSIS — M542 Cervicalgia: Secondary | ICD-10-CM | POA: Diagnosis not present

## 2021-03-26 DIAGNOSIS — M25519 Pain in unspecified shoulder: Secondary | ICD-10-CM | POA: Diagnosis not present

## 2021-04-09 DIAGNOSIS — Z23 Encounter for immunization: Secondary | ICD-10-CM | POA: Diagnosis not present

## 2021-05-05 DIAGNOSIS — F319 Bipolar disorder, unspecified: Secondary | ICD-10-CM | POA: Diagnosis not present

## 2021-05-05 DIAGNOSIS — J208 Acute bronchitis due to other specified organisms: Secondary | ICD-10-CM | POA: Diagnosis not present

## 2021-05-05 DIAGNOSIS — B9689 Other specified bacterial agents as the cause of diseases classified elsewhere: Secondary | ICD-10-CM | POA: Diagnosis not present

## 2021-05-05 DIAGNOSIS — Z6829 Body mass index (BMI) 29.0-29.9, adult: Secondary | ICD-10-CM | POA: Diagnosis not present

## 2021-05-14 DIAGNOSIS — M79645 Pain in left finger(s): Secondary | ICD-10-CM | POA: Diagnosis not present

## 2021-06-30 DIAGNOSIS — K219 Gastro-esophageal reflux disease without esophagitis: Secondary | ICD-10-CM | POA: Diagnosis not present

## 2021-07-05 DIAGNOSIS — H524 Presbyopia: Secondary | ICD-10-CM | POA: Diagnosis not present

## 2021-07-05 DIAGNOSIS — H40003 Preglaucoma, unspecified, bilateral: Secondary | ICD-10-CM | POA: Diagnosis not present

## 2021-07-05 DIAGNOSIS — H43813 Vitreous degeneration, bilateral: Secondary | ICD-10-CM | POA: Diagnosis not present

## 2021-07-05 DIAGNOSIS — Z961 Presence of intraocular lens: Secondary | ICD-10-CM | POA: Diagnosis not present

## 2021-07-05 DIAGNOSIS — H26493 Other secondary cataract, bilateral: Secondary | ICD-10-CM | POA: Diagnosis not present

## 2021-07-21 DIAGNOSIS — Z6829 Body mass index (BMI) 29.0-29.9, adult: Secondary | ICD-10-CM | POA: Diagnosis not present

## 2021-07-21 DIAGNOSIS — S0990XA Unspecified injury of head, initial encounter: Secondary | ICD-10-CM | POA: Diagnosis not present

## 2021-07-21 DIAGNOSIS — M5441 Lumbago with sciatica, right side: Secondary | ICD-10-CM | POA: Diagnosis not present

## 2021-07-21 DIAGNOSIS — G4700 Insomnia, unspecified: Secondary | ICD-10-CM | POA: Diagnosis not present

## 2021-08-05 DIAGNOSIS — Z79899 Other long term (current) drug therapy: Secondary | ICD-10-CM | POA: Diagnosis not present

## 2021-08-05 DIAGNOSIS — R413 Other amnesia: Secondary | ICD-10-CM | POA: Diagnosis not present

## 2021-08-05 DIAGNOSIS — G4700 Insomnia, unspecified: Secondary | ICD-10-CM | POA: Diagnosis not present

## 2021-08-05 DIAGNOSIS — I10 Essential (primary) hypertension: Secondary | ICD-10-CM | POA: Diagnosis not present

## 2021-08-05 DIAGNOSIS — Z0000 Encounter for general adult medical examination without abnormal findings: Secondary | ICD-10-CM | POA: Diagnosis not present

## 2021-08-05 DIAGNOSIS — Z131 Encounter for screening for diabetes mellitus: Secondary | ICD-10-CM | POA: Diagnosis not present

## 2021-08-05 DIAGNOSIS — R635 Abnormal weight gain: Secondary | ICD-10-CM | POA: Diagnosis not present

## 2021-08-05 DIAGNOSIS — E876 Hypokalemia: Secondary | ICD-10-CM | POA: Diagnosis not present

## 2021-08-05 DIAGNOSIS — E785 Hyperlipidemia, unspecified: Secondary | ICD-10-CM | POA: Diagnosis not present

## 2021-08-09 DIAGNOSIS — M5459 Other low back pain: Secondary | ICD-10-CM | POA: Diagnosis not present

## 2021-08-17 DIAGNOSIS — M5459 Other low back pain: Secondary | ICD-10-CM | POA: Diagnosis not present

## 2021-08-24 DIAGNOSIS — M79645 Pain in left finger(s): Secondary | ICD-10-CM | POA: Diagnosis not present

## 2021-08-25 DIAGNOSIS — M5459 Other low back pain: Secondary | ICD-10-CM | POA: Diagnosis not present

## 2021-08-28 DIAGNOSIS — R059 Cough, unspecified: Secondary | ICD-10-CM | POA: Diagnosis not present

## 2021-08-28 DIAGNOSIS — K591 Functional diarrhea: Secondary | ICD-10-CM | POA: Diagnosis not present

## 2021-09-20 DIAGNOSIS — J309 Allergic rhinitis, unspecified: Secondary | ICD-10-CM | POA: Diagnosis not present

## 2021-09-23 DIAGNOSIS — E876 Hypokalemia: Secondary | ICD-10-CM | POA: Diagnosis not present

## 2021-09-23 DIAGNOSIS — R252 Cramp and spasm: Secondary | ICD-10-CM | POA: Diagnosis not present

## 2021-09-23 DIAGNOSIS — Z6828 Body mass index (BMI) 28.0-28.9, adult: Secondary | ICD-10-CM | POA: Diagnosis not present

## 2021-10-04 DIAGNOSIS — E876 Hypokalemia: Secondary | ICD-10-CM | POA: Diagnosis not present

## 2021-10-04 DIAGNOSIS — K5900 Constipation, unspecified: Secondary | ICD-10-CM | POA: Diagnosis not present

## 2021-10-04 DIAGNOSIS — E040 Nontoxic diffuse goiter: Secondary | ICD-10-CM | POA: Diagnosis not present

## 2021-10-04 DIAGNOSIS — Z6827 Body mass index (BMI) 27.0-27.9, adult: Secondary | ICD-10-CM | POA: Diagnosis not present

## 2021-10-04 DIAGNOSIS — R739 Hyperglycemia, unspecified: Secondary | ICD-10-CM | POA: Diagnosis not present

## 2021-10-08 DIAGNOSIS — E040 Nontoxic diffuse goiter: Secondary | ICD-10-CM | POA: Diagnosis not present

## 2021-10-08 DIAGNOSIS — E049 Nontoxic goiter, unspecified: Secondary | ICD-10-CM | POA: Diagnosis not present

## 2021-10-12 DIAGNOSIS — R413 Other amnesia: Secondary | ICD-10-CM | POA: Diagnosis not present

## 2021-10-12 DIAGNOSIS — I10 Essential (primary) hypertension: Secondary | ICD-10-CM | POA: Diagnosis not present

## 2021-10-12 DIAGNOSIS — E040 Nontoxic diffuse goiter: Secondary | ICD-10-CM | POA: Diagnosis not present

## 2021-10-12 DIAGNOSIS — M2550 Pain in unspecified joint: Secondary | ICD-10-CM | POA: Diagnosis not present

## 2021-10-12 DIAGNOSIS — Z6828 Body mass index (BMI) 28.0-28.9, adult: Secondary | ICD-10-CM | POA: Diagnosis not present

## 2021-10-12 DIAGNOSIS — J301 Allergic rhinitis due to pollen: Secondary | ICD-10-CM | POA: Diagnosis not present

## 2021-10-12 DIAGNOSIS — R1313 Dysphagia, pharyngeal phase: Secondary | ICD-10-CM | POA: Diagnosis not present

## 2021-10-21 ENCOUNTER — Other Ambulatory Visit (HOSPITAL_BASED_OUTPATIENT_CLINIC_OR_DEPARTMENT_OTHER): Payer: Self-pay | Admitting: Physician Assistant

## 2021-10-21 ENCOUNTER — Other Ambulatory Visit (HOSPITAL_BASED_OUTPATIENT_CLINIC_OR_DEPARTMENT_OTHER): Payer: Self-pay | Admitting: Family Medicine

## 2021-10-21 DIAGNOSIS — R413 Other amnesia: Secondary | ICD-10-CM

## 2021-10-27 ENCOUNTER — Ambulatory Visit (HOSPITAL_BASED_OUTPATIENT_CLINIC_OR_DEPARTMENT_OTHER)
Admission: RE | Admit: 2021-10-27 | Discharge: 2021-10-27 | Disposition: A | Discharge status: Home / Self Care | Payer: Medicare HMO | Source: Ambulatory Visit | Attending: Family Medicine | Admitting: Family Medicine

## 2021-10-27 ENCOUNTER — Encounter (HOSPITAL_BASED_OUTPATIENT_CLINIC_OR_DEPARTMENT_OTHER): Payer: Self-pay

## 2021-10-27 DIAGNOSIS — R413 Other amnesia: Secondary | ICD-10-CM | POA: Diagnosis not present

## 2021-11-08 DIAGNOSIS — Z6827 Body mass index (BMI) 27.0-27.9, adult: Secondary | ICD-10-CM | POA: Diagnosis not present

## 2021-11-08 DIAGNOSIS — I499 Cardiac arrhythmia, unspecified: Secondary | ICD-10-CM | POA: Diagnosis not present

## 2021-11-08 DIAGNOSIS — R413 Other amnesia: Secondary | ICD-10-CM | POA: Diagnosis not present

## 2021-11-08 DIAGNOSIS — Z1231 Encounter for screening mammogram for malignant neoplasm of breast: Secondary | ICD-10-CM | POA: Diagnosis not present

## 2021-11-08 DIAGNOSIS — F319 Bipolar disorder, unspecified: Secondary | ICD-10-CM | POA: Diagnosis not present

## 2021-11-10 ENCOUNTER — Other Ambulatory Visit: Payer: Self-pay

## 2021-11-10 DIAGNOSIS — J309 Allergic rhinitis, unspecified: Secondary | ICD-10-CM | POA: Insufficient documentation

## 2021-11-10 DIAGNOSIS — G4700 Insomnia, unspecified: Secondary | ICD-10-CM | POA: Insufficient documentation

## 2021-11-10 DIAGNOSIS — R413 Other amnesia: Secondary | ICD-10-CM | POA: Insufficient documentation

## 2021-11-10 DIAGNOSIS — I10 Essential (primary) hypertension: Secondary | ICD-10-CM | POA: Insufficient documentation

## 2021-11-10 DIAGNOSIS — E785 Hyperlipidemia, unspecified: Secondary | ICD-10-CM | POA: Insufficient documentation

## 2021-11-10 DIAGNOSIS — E040 Nontoxic diffuse goiter: Secondary | ICD-10-CM | POA: Insufficient documentation

## 2021-11-10 DIAGNOSIS — N3281 Overactive bladder: Secondary | ICD-10-CM | POA: Insufficient documentation

## 2021-11-10 DIAGNOSIS — R1313 Dysphagia, pharyngeal phase: Secondary | ICD-10-CM | POA: Insufficient documentation

## 2021-11-10 DIAGNOSIS — E876 Hypokalemia: Secondary | ICD-10-CM | POA: Insufficient documentation

## 2021-11-10 DIAGNOSIS — M2550 Pain in unspecified joint: Secondary | ICD-10-CM | POA: Insufficient documentation

## 2021-11-13 DIAGNOSIS — R059 Cough, unspecified: Secondary | ICD-10-CM | POA: Diagnosis not present

## 2021-11-13 DIAGNOSIS — R5383 Other fatigue: Secondary | ICD-10-CM | POA: Diagnosis not present

## 2021-11-27 DIAGNOSIS — J218 Acute bronchiolitis due to other specified organisms: Secondary | ICD-10-CM | POA: Diagnosis not present

## 2021-11-27 DIAGNOSIS — J309 Allergic rhinitis, unspecified: Secondary | ICD-10-CM | POA: Diagnosis not present

## 2021-11-29 DIAGNOSIS — R221 Localized swelling, mass and lump, neck: Secondary | ICD-10-CM | POA: Diagnosis not present

## 2021-11-29 DIAGNOSIS — K5900 Constipation, unspecified: Secondary | ICD-10-CM | POA: Diagnosis not present

## 2021-11-29 DIAGNOSIS — K219 Gastro-esophageal reflux disease without esophagitis: Secondary | ICD-10-CM | POA: Diagnosis not present

## 2021-11-29 DIAGNOSIS — E0789 Other specified disorders of thyroid: Secondary | ICD-10-CM | POA: Diagnosis not present

## 2021-12-09 ENCOUNTER — Encounter: Payer: Self-pay | Admitting: Cardiology

## 2021-12-09 ENCOUNTER — Ambulatory Visit (INDEPENDENT_AMBULATORY_CARE_PROVIDER_SITE_OTHER): Payer: Medicare HMO | Admitting: Cardiology

## 2021-12-09 VITALS — BP 114/60 | HR 94 | Ht 62.0 in | Wt 147.8 lb

## 2021-12-09 DIAGNOSIS — I493 Ventricular premature depolarization: Secondary | ICD-10-CM

## 2021-12-09 DIAGNOSIS — R9431 Abnormal electrocardiogram [ECG] [EKG]: Secondary | ICD-10-CM

## 2021-12-09 DIAGNOSIS — I10 Essential (primary) hypertension: Secondary | ICD-10-CM | POA: Diagnosis not present

## 2021-12-09 DIAGNOSIS — R0609 Other forms of dyspnea: Secondary | ICD-10-CM

## 2021-12-09 HISTORY — DX: Other forms of dyspnea: R06.09

## 2021-12-09 HISTORY — DX: Abnormal electrocardiogram (ECG) (EKG): R94.31

## 2021-12-09 HISTORY — DX: Ventricular premature depolarization: I49.3

## 2021-12-09 NOTE — Progress Notes (Signed)
Cardiology Office Note:    Date:  12/09/2021   ID:  Kelsey Soto, DOB Feb 05, 1951, MRN [id number]  PCP:  Serita Grammes, MD  Cardiologist:  Jenean Lindau, MD   Referring MD: Serita Grammes, MD    ASSESSMENT:    1. Hypertension, unspecified type   2. Dyspnea on exertion   3. Abnormal electrocardiogram (ECG) (EKG)    PLAN:    In order of problems listed above:  Primary prevention stressed with patient.  Importance of compliance with diet and medication stressed and she vocalized understanding. Essential hypertension: Blood pressure stable and diet was emphasized.  Lifestyle modification urged. Dyspnea on exertion: She wants to be evaluated as she wants to embark on a formal exercise program and we will do an exercise stress echo to evaluate her. Frequent PVCs: These are overall unremarkable and asymptomatic.  Above evaluation will help management if there is any intervention that needs to be done. Lipids followed by primary care.Patient will be seen in follow-up appointment in 6 months or earlier if the patient has any concerns    Medication Adjustments/Labs and Tests Ordered: Current medicines are reviewed at length with the patient today.  Concerns regarding medicines are outlined above.  Orders Placed This Encounter  Procedures   EKG 12-Lead   ECHOCARDIOGRAM STRESS TEST   No orders of the defined types were placed in this encounter.    Chief Complaint  Patient presents with   Irregular Heart Beat     History of Present Illness:    Kelsey Soto is a 71 y.o. female.  Patient has past medical history of essential hypertension.  She is evaluated here for abnormal EKG.  Patient's EKG had PVCs and possible lateral wall myocardial infarction and so she was here for that evaluation.  She denies any chest pain orthopnea or PND.  She has some shortness of breath on exertion.  She is unclear about her cholesterol status.  She has history of essential hypertension.  At  the time of my evaluation, the patient is alert awake oriented and in no distress.  Past Medical History:  Diagnosis Date   Allergic rhinitis    Dysphagia, cricopharyngeal    Goiter diffuse    Hyperlipidemia    Hypertension    Hypokalemia    Insomnia    Memory loss, short term    Multiple joint pain    Overactive bladder    Pain of left hand 09/25/2018    Past Surgical History:  Procedure Laterality Date   TOTAL VAGINAL HYSTERECTOMY      Current Medications: Current Meds  Medication Sig   amitriptyline (ELAVIL) 10 MG tablet Take 20 mg by mouth 2 (two) times daily.   aspirin EC 81 MG tablet Take 81 mg by mouth daily.   cetirizine (ZYRTEC) 10 MG tablet Take 10 mg by mouth daily.   esomeprazole (NEXIUM) 40 MG capsule Take 40 mg by mouth daily.   hydrochlorothiazide (HYDRODIURIL) 25 MG tablet Take 25 mg by mouth daily.   losartan (COZAAR) 25 MG tablet Take 25 mg by mouth daily.   memantine (NAMENDA) 10 MG tablet Take 10 mg by mouth 2 (two) times daily.   mometasone (NASONEX) 50 MCG/ACT nasal spray Place 1 spray into the nose 2 (two) times daily.   oxybutynin (DITROPAN) 5 MG tablet Take 5 mg by mouth daily.   potassium chloride (KLOR-CON M) 10 MEQ tablet Take 10 mEq by mouth daily.     Allergies:   Patient has  no known allergies.   Social History   Socioeconomic History   Marital status: Married    Spouse name: Not on file   Number of children: Not on file   Years of education: Not on file   Highest education level: Not on file  Occupational History   Not on file  Tobacco Use   Smoking status: Never   Smokeless tobacco: Never  Substance and Sexual Activity   Alcohol use: Not on file   Drug use: Not on file   Sexual activity: Not on file  Other Topics Concern   Not on file  Social History Narrative   Not on file   Social Determinants of Health   Financial Resource Strain: Not on file  Food Insecurity: Not on file  Transportation Needs: Not on file  Physical  Activity: Not on file  Stress: Not on file  Social Connections: Not on file     Family History: The patient's family history includes CVA in her father and mother.  ROS:   Please see the history of present illness.    All other systems reviewed and are negative.  EKGs/Labs/Other Studies Reviewed:    The following studies were reviewed today: EKG reveals sinus rhythm nonspecific ST changes PVCs and lateral wall myocardial infarction of undetermined age.   Recent Labs: No results found for requested labs within last 365 days.  Recent Lipid Panel No results found for: "CHOL", "TRIG", "HDL", "CHOLHDL", "VLDL", "LDLCALC", "LDLDIRECT"  Physical Exam:    VS:  BP 114/60 (BP Location: Left Arm, Patient Position: Sitting)   Pulse 94   Ht '5\' 2"'$  (1.575 m)   Wt 147 lb 12.8 oz (67 kg)   SpO2 98%   BMI 27.03 kg/m     Wt Readings from Last 3 Encounters:  12/09/21 147 lb 12.8 oz (67 kg)     GEN: Patient is in no acute distress HEENT: Normal NECK: No JVD; No carotid bruits LYMPHATICS: No lymphadenopathy CARDIAC: Hear sounds regular, 2/6 systolic murmur at the apex. RESPIRATORY:  Clear to auscultation without rales, wheezing or rhonchi  ABDOMEN: Soft, non-tender, non-distended MUSCULOSKELETAL:  No edema; No deformity  SKIN: Warm and dry NEUROLOGIC:  Alert and oriented x 3 PSYCHIATRIC:  Normal affect   Signed, Jenean Lindau, MD  12/09/2021 3:17 PM    [facility]

## 2021-12-09 NOTE — Patient Instructions (Signed)
Medication Instructions:  Your physician recommends that you continue on your current medications as directed. Please refer to the Current Medication list given to you today.  *If you need a refill on your cardiac medications before your next appointment, please call your pharmacy*   Lab Work: None ordered If you have labs (blood work) drawn today and your tests are completely normal, you will receive your results only by: MyChart Message (if you have MyChart) OR A paper copy in the mail If you have any lab test that is abnormal or we need to change your treatment, we will call you to review the results.   Testing/Procedures: Stress Echocardiogram Information Sheet Instructions:  This test will be done at [facility].  Nothing to eat or drink after midnight.  Dress prepared to exercise.  Please bring all current prescription medications.   Follow-Up: At [facility], you and your health needs are our priority.  As part of our continuing mission to provide you with exceptional heart care, we have created designated Provider Care Teams.  These Care Teams include your primary Cardiologist (physician) and Advanced Practice Providers (APPs -  Physician Assistants and Nurse Practitioners) who all work together to provide you with the care you need, when you need it.  We recommend signing up for the patient portal called "MyChart".  Sign up information is provided on this After Visit Summary.  MyChart is used to connect with patients for Virtual Visits (Telemedicine).  Patients are able to view lab/test results, encounter notes, upcoming appointments, etc.  Non-urgent messages can be sent to your provider as well.   To learn more about what you can do with MyChart, go to [email or web address].    Your next appointment:   12 month(s)  The format for your next appointment:   In Person  Provider:   Rajan Revankar, MD   Other Instructions Exercise Stress Echocardiogram An  exercise stress echocardiogram is a test to check how well your heart is working. This test uses sound waves (ultrasound) and a computer to make images of your heart before and after exercise. Ultrasound images that are taken before you exercise (resting echocardiogram) will show how much blood is getting to your heart muscle and how well yourheart muscle and heart valves are functioning. During the next part of this test, you will walk on a treadmill or ride a stationary bicycle to see how exercise affects your heart. While you exercise, the electrical activity of your heart will be monitored with anelectrocardiogram (ECG). Your blood pressure will also be monitored. You may have this test if you have: Chest pain or other symptoms of a heart problem. Recently had a heart attack or heart surgery. Heart valve problems. A condition that causes narrowing of the blood vessels that supply your heart (coronary artery disease). A high risk of heart disease and are starting a new exercise program. A high risk of heart disease and need to have major surgery. Heart arrhythmia (abnormal heart rhythm) problems. Heart failure problems. Tell a health care provider about: Any allergies you have. All medicines you are taking, including vitamins, herbs, eye drops, creams, and over-the-counter medicines. Any problems you or family members have had with anesthetic medicines. Any surgeries you have had. Any blood disorders you have. Any medical conditions you have. Whether you are pregnant or may be pregnant. What are the risks? Generally, this is a safe test. However, problems may occur, including: Chest pain. Dizziness or light-headedness. Shortness of breath. Increased or irregular   heartbeat (palpitations). Nausea or vomiting. Heart attack. This is very rare. What happens before the test? Medicines Ask your health care provider about changing or stopping your regular medicines. This is especially  important if you are taking diabetes medicines or blood thinners. If you use an inhaler, bring it with you to the test. General instructions Wear loose, comfortable clothing and walking shoes. Follow instructions from your health care provider about eating or drinking restrictions. You may be asked to avoid all forms of caffeine for 24 hours before your test, or as told by your health care provider. Do not use any products that contain nicotine or tobacco for 4 hours before the test, or as told by your health care provider. These products include cigarettes, chewing tobacco, and vaping devices, such as e-cigarettes. If you need help quitting, ask your health care provider. What happens during the test?  You will take off your clothes from the waist up and put on a hospital gown. Electrodes or electrocardiogram (ECG) patches may be placed on your chest. The electrodes or patches are then connected to a device that monitors your heart rate and rhythm. A blood pressure cuff will be placed on your arm. You will lie down on a table for an ultrasound exam before you exercise. A gel will be applied to your chest to help sound waves pass through your skin. A handheld device, called a transducer, will be pressed against your chest and moved over your heart. The transducer produces sound waves that travel to your heart and bounce back (or "echo" back) to the transducer. These sound waves will be captured in real-time and changed into images of your heart that can be viewed on a video monitor. The images will be recorded on a computer and reviewed by your health care provider. Once the ultrasound is complete, you will start exercising by walking on a treadmill or pedaling a stationary bicycle. Your blood pressure and heart rhythm will be monitored while you exercise. The exercise will gradually get harder or faster. You will exercise until: Your heart reaches a target level. You are too tired to  continue. You cannot continue because of chest pain, weakness, or dizziness. You will have another ultrasound exam immediately after you stop exercising. The procedure may vary among health care providers and hospitals. What can I expect after the test? After your test, it is common to have: Mild soreness. Mild fatigue. Your heart rate and blood pressure will be monitored until they return to yournormal levels. You should not have any new symptoms after this test. Follow these instructions at home: After your stress test, you should be able to return to your normal activities and diet. Take over-the-counter and prescription medicines only as told by your health care provider. Keep all follow-up visits. This is important. It is up to you to get the results of your test. Ask your health care provider, or the department that is doing the test, when your results will be ready. Contact a health care provider if you: Feel dizzy or light-headed. Have a fast or irregular heartbeat. Have nausea or vomiting. Have a headache. Feel short of breath. Get help right away if you: Develop pain or pressure: In your chest. In your jaw or neck. Between your shoulder blades. Radiating down your left arm. Faint. Have trouble breathing. These symptoms may represent a serious problem that is an emergency. Do not wait to see if the symptoms will go away. Get medical help right away. Call   your local emergency services (911 in the [place] Do not drive yourself to the hospital. Summary An exercise stress echocardiogram is a test that uses ultrasound to check how well your heart works before and after exercise. Before the test, follow instructions from your health care provider about stopping medicines and avoiding caffeine, nicotine and tobacco, and certain foods and drinks. During the test, your blood pressure and heart rhythm will be monitored while you exercise on a treadmill or stationary bicycle. This  information is not intended to replace advice given to you by your health care provider. Make sure you discuss any questions you have with your healthcare provider. Document Revised: 02/04/2020 Document Reviewed: 02/04/2020 Elsevier Patient Education  2022 [employer]  

## 2021-12-22 ENCOUNTER — Encounter: Payer: Self-pay | Admitting: Cardiology

## 2021-12-22 DIAGNOSIS — R9431 Abnormal electrocardiogram [ECG] [EKG]: Secondary | ICD-10-CM | POA: Diagnosis not present

## 2021-12-22 DIAGNOSIS — R0609 Other forms of dyspnea: Secondary | ICD-10-CM | POA: Diagnosis not present

## 2021-12-22 DIAGNOSIS — R0600 Dyspnea, unspecified: Secondary | ICD-10-CM | POA: Diagnosis not present

## 2021-12-29 ENCOUNTER — Telehealth: Payer: Self-pay

## 2021-12-29 NOTE — Telephone Encounter (Signed)
Full Mailbox  RH stress echo results normal.

## 2022-01-04 NOTE — Telephone Encounter (Signed)
Results reviewed with pt as per Dr. Revankar's note.  Pt verbalized understanding and had no additional questions. Routed to PCP.  

## 2022-01-17 DIAGNOSIS — K219 Gastro-esophageal reflux disease without esophagitis: Secondary | ICD-10-CM | POA: Insufficient documentation

## 2022-01-18 ENCOUNTER — Other Ambulatory Visit: Payer: Self-pay | Admitting: Otolaryngology

## 2022-01-18 DIAGNOSIS — K219 Gastro-esophageal reflux disease without esophagitis: Secondary | ICD-10-CM

## 2022-01-25 DIAGNOSIS — Z6827 Body mass index (BMI) 27.0-27.9, adult: Secondary | ICD-10-CM | POA: Diagnosis not present

## 2022-01-25 DIAGNOSIS — R1313 Dysphagia, pharyngeal phase: Secondary | ICD-10-CM | POA: Diagnosis not present

## 2022-01-25 DIAGNOSIS — Z111 Encounter for screening for respiratory tuberculosis: Secondary | ICD-10-CM | POA: Diagnosis not present

## 2022-01-25 DIAGNOSIS — E040 Nontoxic diffuse goiter: Secondary | ICD-10-CM | POA: Diagnosis not present

## 2022-01-25 DIAGNOSIS — I499 Cardiac arrhythmia, unspecified: Secondary | ICD-10-CM | POA: Diagnosis not present

## 2022-01-27 ENCOUNTER — Ambulatory Visit
Admission: RE | Admit: 2022-01-27 | Discharge: 2022-01-27 | Disposition: A | Discharge status: Home / Self Care | Payer: Medicare HMO | Source: Ambulatory Visit | Attending: Otolaryngology | Admitting: Otolaryngology

## 2022-01-27 DIAGNOSIS — K219 Gastro-esophageal reflux disease without esophagitis: Secondary | ICD-10-CM | POA: Diagnosis not present

## 2022-01-27 DIAGNOSIS — K224 Dyskinesia of esophagus: Secondary | ICD-10-CM | POA: Diagnosis not present

## 2022-01-28 DIAGNOSIS — M79645 Pain in left finger(s): Secondary | ICD-10-CM | POA: Diagnosis not present

## 2022-02-24 DIAGNOSIS — K2100 Gastro-esophageal reflux disease with esophagitis, without bleeding: Secondary | ICD-10-CM | POA: Diagnosis not present

## 2022-02-24 DIAGNOSIS — R1319 Other dysphagia: Secondary | ICD-10-CM | POA: Diagnosis not present

## 2022-02-24 DIAGNOSIS — K209 Esophagitis, unspecified without bleeding: Secondary | ICD-10-CM | POA: Diagnosis not present

## 2022-02-25 ENCOUNTER — Other Ambulatory Visit: Payer: Self-pay | Admitting: Gastroenterology

## 2022-03-04 ENCOUNTER — Encounter (HOSPITAL_COMMUNITY)
Admission: RE | Disposition: A | Discharge status: Home / Self Care | Payer: Self-pay | Source: Home / Self Care | Attending: Gastroenterology

## 2022-03-04 ENCOUNTER — Ambulatory Visit (HOSPITAL_COMMUNITY): Payer: Medicare HMO | Admitting: Certified Registered Nurse Anesthetist

## 2022-03-04 ENCOUNTER — Ambulatory Visit (HOSPITAL_BASED_OUTPATIENT_CLINIC_OR_DEPARTMENT_OTHER): Payer: Medicare HMO | Admitting: Certified Registered Nurse Anesthetist

## 2022-03-04 ENCOUNTER — Encounter (HOSPITAL_COMMUNITY): Payer: Self-pay | Admitting: Gastroenterology

## 2022-03-04 ENCOUNTER — Ambulatory Visit (HOSPITAL_COMMUNITY)
Admission: RE | Admit: 2022-03-04 | Discharge: 2022-03-04 | Disposition: A | Discharge status: Home / Self Care | Payer: Medicare HMO | Attending: Gastroenterology | Admitting: Gastroenterology

## 2022-03-04 ENCOUNTER — Other Ambulatory Visit: Payer: Self-pay

## 2022-03-04 DIAGNOSIS — K224 Dyskinesia of esophagus: Secondary | ICD-10-CM | POA: Insufficient documentation

## 2022-03-04 DIAGNOSIS — K219 Gastro-esophageal reflux disease without esophagitis: Secondary | ICD-10-CM | POA: Insufficient documentation

## 2022-03-04 DIAGNOSIS — R1314 Dysphagia, pharyngoesophageal phase: Secondary | ICD-10-CM | POA: Diagnosis not present

## 2022-03-04 DIAGNOSIS — K209 Esophagitis, unspecified without bleeding: Secondary | ICD-10-CM | POA: Diagnosis not present

## 2022-03-04 DIAGNOSIS — K2100 Gastro-esophageal reflux disease with esophagitis, without bleeding: Secondary | ICD-10-CM

## 2022-03-04 DIAGNOSIS — E785 Hyperlipidemia, unspecified: Secondary | ICD-10-CM | POA: Diagnosis not present

## 2022-03-04 DIAGNOSIS — K319 Disease of stomach and duodenum, unspecified: Secondary | ICD-10-CM | POA: Insufficient documentation

## 2022-03-04 DIAGNOSIS — I10 Essential (primary) hypertension: Secondary | ICD-10-CM | POA: Diagnosis not present

## 2022-03-04 DIAGNOSIS — F0390 Unspecified dementia without behavioral disturbance: Secondary | ICD-10-CM | POA: Diagnosis not present

## 2022-03-04 DIAGNOSIS — R1319 Other dysphagia: Secondary | ICD-10-CM | POA: Diagnosis not present

## 2022-03-04 DIAGNOSIS — R1310 Dysphagia, unspecified: Secondary | ICD-10-CM | POA: Diagnosis not present

## 2022-03-04 DIAGNOSIS — K222 Esophageal obstruction: Secondary | ICD-10-CM | POA: Diagnosis not present

## 2022-03-04 DIAGNOSIS — R933 Abnormal findings on diagnostic imaging of other parts of digestive tract: Secondary | ICD-10-CM | POA: Diagnosis not present

## 2022-03-04 HISTORY — PX: BALLOON DILATION: SHX5330

## 2022-03-04 HISTORY — PX: BIOPSY: SHX5522

## 2022-03-04 HISTORY — PX: ESOPHAGOGASTRODUODENOSCOPY (EGD) WITH PROPOFOL: SHX5813

## 2022-03-04 SURGERY — ESOPHAGOGASTRODUODENOSCOPY (EGD) WITH PROPOFOL
Anesthesia: Monitor Anesthesia Care

## 2022-03-04 MED ORDER — SODIUM CHLORIDE 0.9 % IV SOLN: INTRAVENOUS | Status: DC

## 2022-03-04 MED ORDER — LACTATED RINGERS IV SOLN: INTRAVENOUS | Status: DC | PRN

## 2022-03-04 MED ORDER — PROPOFOL 500 MG/50ML IV EMUL
INTRAVENOUS | Status: DC | PRN
  Administered 2022-03-04: 75 ug/kg/min via INTRAVENOUS
  Administered 2022-03-04: 50 mg via INTRAVENOUS

## 2022-03-04 MED ORDER — LACTATED RINGERS IV SOLN: INTRAVENOUS | Status: DC

## 2022-03-04 SURGICAL SUPPLY — 15 items

## 2022-03-04 NOTE — Anesthesia Postprocedure Evaluation (Signed)
Anesthesia Post Note  Patient: Kelsey Soto  Procedure(s) Performed: ESOPHAGOGASTRODUODENOSCOPY (EGD) WITH PROPOFOL BIOPSY BALLOON DILATION     Patient location during evaluation: Endoscopy Anesthesia Type: MAC Level of consciousness: awake and alert Pain management: pain level controlled Vital Signs Assessment: post-procedure vital signs reviewed and stable Respiratory status: spontaneous breathing, nonlabored ventilation, respiratory function stable and patient connected to nasal cannula oxygen Cardiovascular status: blood pressure returned to baseline and stable Postop Assessment: no apparent nausea or vomiting Anesthetic complications: no   No notable events documented.  Last Vitals:  Vitals:   03/04/22 1030 03/04/22 1040  BP: (!) 136/57 (!) 146/90  Pulse: 94 79  Resp: (!) 26 20  Temp:    SpO2: 99% 97%    Last Pain:  Vitals:   03/04/22 1040  TempSrc:   PainSc: 0-No pain                 Kelsey Soto

## 2022-03-04 NOTE — H&P (Signed)
Primary Care Physician:  Serita Grammes, MD Primary Gastroenterologist:  [facility]  Reason for Visit : GERD, abnormal barium swallow, dysphagia  HPI: Kelsey Soto is a 71 y.o. female was seen in the office for further evaluation of reflux and dysphagia.  A.m. swallow in August 2023 showed mild esophageal dysmotility as well as multiple esophageal ulcers finding concerning for severe esophagitis and EGD was recommended.  Patient having intermittent trouble swallowing solid foods and trouble with large pills.  She denies any blood in the stool or black stool.  Denies abdominal pain.  Past Medical History:  Diagnosis Date   Allergic rhinitis    Dysphagia, cricopharyngeal    Goiter diffuse    Hyperlipidemia    Hypertension    Hypokalemia    Insomnia    Memory loss, short term    Multiple joint pain    Overactive bladder    Pain of left hand 09/25/2018    Past Surgical History:  Procedure Laterality Date   TOTAL VAGINAL HYSTERECTOMY      Prior to Admission medications   Medication Sig Start Date End Date Taking? Authorizing Provider  amitriptyline (ELAVIL) 10 MG tablet Take 40 mg by mouth at bedtime. 09/07/21  Yes [provider]  aspirin EC 81 MG tablet Take 81 mg by mouth daily.   Yes [provider]  esomeprazole (NEXIUM) 40 MG capsule Take 40 mg by mouth 2 (two) times daily before a meal. 07/01/21  Yes [provider]  hydrochlorothiazide (HYDRODIURIL) 25 MG tablet Take 25 mg by mouth daily. 10/16/21  Yes [provider]  losartan (COZAAR) 25 MG tablet Take 25 mg by mouth daily. 09/07/21  Yes [provider]  memantine (NAMENDA) 10 MG tablet Take 10 mg by mouth 2 (two) times daily. 08/09/21  Yes [provider]  mometasone (NASONEX) 50 MCG/ACT nasal spray Place 1 spray into the nose daily as needed (allergies). 09/20/21  Yes [provider]  OVER THE COUNTER MEDICATION Take 1 capsule by mouth daily. Liver Renew   Yes  [provider]  oxybutynin (DITROPAN) 5 MG tablet Take 5 mg by mouth daily. 10/21/21  Yes [provider]  potassium chloride (KLOR-CON M) 10 MEQ tablet Take 10 mEq by mouth daily. 10/13/21  Yes [provider]    Scheduled Meds: Continuous Infusions:  lactated ringers 20 mL/hr at 03/04/22 0906   PRN Meds:.  Allergies as of 02/25/2022   (No Known Allergies)    Family History  Problem Relation Age of Onset   CVA Mother    CVA Father     Social History   Socioeconomic History   Marital status: Married    Spouse name: Not on file   Number of children: Not on file   Years of education: Not on file   Highest education level: Not on file  Occupational History   Not on file  Tobacco Use   Smoking status: Never   Smokeless tobacco: Never  Substance and Sexual Activity   Alcohol use: Not on file   Drug use: Not on file   Sexual activity: Not on file  Other Topics Concern   Not on file  Social History Narrative   Not on file   Social Determinants of Health   Financial Resource Strain: Not on file  Food Insecurity: Not on file  Transportation Needs: Not on file  Physical Activity: Not on file  Stress: Not on file  Social Connections: Not on file  Intimate  Partner Violence: Not on file    Physical Exam: Vital signs: Vitals:   03/04/22 0901  BP: (!) 159/97  Pulse: 62  Resp: 15  Temp: (!) 97.4 F (36.3 C)  SpO2: 100%     General:   Alert,  Well-developed, well-nourished, pleasant and cooperative in NAD Lungs:  Clear throughout to auscultation.   No wheezes, crackles, or rhonchi. No acute distress. Heart:  Regular rate and rhythm; no murmurs, clicks, rubs,  or gallops. Abdomen: Soft, nontender, nondistended, bowel sounds present, no peritoneal signs Rectal:  Deferred  GI:  Lab Results: No results for input(s): "WBC", "HGB", "HCT", "PLT" in the last 72 hours. BMET No results for input(s): "NA", "K", "CL", "CO2", "GLUCOSE", "BUN",  "CREATININE", "CALCIUM" in the last 72 hours. LFT No results for input(s): "PROT", "ALBUMIN", "AST", "ALT", "ALKPHOS", "BILITOT", "BILIDIR", "IBILI" in the last 72 hours. PT/INR No results for input(s): "LABPROT", "INR" in the last 72 hours.   Studies/Results: No results found.  Impression/Plan: -Abnormal barium swallow -Dysphagia - GERD   Recommendations --------------------------- -Proceed with EGD with possible dilation depending on endoscopic findings.  Risks (bleeding, infection, bowel perforation that could require surgery, sedation-related changes in cardiopulmonary systems), benefits (identification and possible treatment of source of symptoms, exclusion of certain causes of symptoms), and alternatives (watchful waiting, radiographic imaging studies, empiric medical treatment)  were explained to patient/family in detail and patient wishes to proceed.     LOS: 0 days   Otis Brace  MD, [place] 03/04/2022, 9:16 AM  Contact #  [phone]

## 2022-03-04 NOTE — Transfer of Care (Signed)
Immediate Anesthesia Transfer of Care Note  Patient: Kelsey Soto  Procedure(s) Performed: Procedure(s): ESOPHAGOGASTRODUODENOSCOPY (EGD) WITH PROPOFOL (N/A) BIOPSY BALLOON DILATION (N/A)  Patient Location: PACU and Endoscopy Unit  Anesthesia Type:MAC  Level of Consciousness: awake, alert  and oriented  Airway & Oxygen Therapy: Patient Spontanous Breathing and Patient connected to nasal cannula oxygen  Post-op Assessment: Report given to RN and Post -op Vital signs reviewed and stable  Post vital signs: Reviewed and stable  Last Vitals:  Vitals:   03/04/22 0901  BP: (!) 159/97  Pulse: 62  Resp: 15  Temp: (!) 36.3 C  SpO2: 629%    Complications: No apparent anesthesia complications

## 2022-03-04 NOTE — Op Note (Signed)
[facility] Patient Name: Kelsey Soto Procedure Date: 03/04/2022 MRN: [id number] Attending MD: Otis Brace , MD Date of Birth: 1950-10-22 CSN: [id number] Age: 71 Admit Type: Outpatient Procedure:                Upper GI endoscopy Indications:              Dysphagia, Abnormal cine-esophagram Providers:                Otis Brace, MD, Carlyn Reichert, RN, Darliss Cheney,                            Technician Referring MD:              Medicines:                Sedation Administered by an Anesthesia Professional Complications:            No immediate complications. Estimated Blood Loss:     Estimated blood loss was minimal. Procedure:                Pre-Anesthesia Assessment:                           - Prior to the procedure, a History and Physical                            was performed, and patient medications and                            allergies were reviewed. The patient's tolerance of                            previous anesthesia was also reviewed. The risks                            and benefits of the procedure and the sedation                            options and risks were discussed with the patient.                            All questions were answered, and informed consent                            was obtained. Prior Anticoagulants: The patient has                            taken no previous anticoagulant or antiplatelet                            agents except for aspirin. ASA Grade Assessment: II                            - A patient with mild systemic disease. After  reviewing the risks and benefits, the patient was                            deemed in satisfactory condition to undergo the                            procedure.                           After obtaining informed consent, the endoscope was                            passed under direct vision. Throughout the                            procedure, the  patient's blood pressure, pulse, and                            oxygen saturations were monitored continuously. The                            [id number]-H190 ([id number]) Olympus endoscope was introduced                            through the mouth, and advanced to the second part                            of duodenum. The upper GI endoscopy was                            accomplished without difficulty. The patient                            tolerated the procedure well. Scope In: Scope Out: Findings:      Abnormal motility was noted in the esophagus. A TTS dilator was passed       through the scope. Dilationof GE junction with a 15-16.5-18 mm balloon       dilator was performed to 18 mm. The dilation site was examined following       endoscope reinsertion and showed no bleeding, mucosal tear or       perforation.      Mildly severe esophagitis with no bleeding was found in the mid       esophagus. Biopsies were taken with a cold forceps for histology.      No gross lesions were noted in the entire examined stomach. Biopsies       were taken with a cold forceps for histology.      The cardia and gastric fundus were normal on retroflexion.      The duodenal bulb, first portion of the duodenum and second portion of       the duodenum were normal. Impression:               - Abnormal esophageal motility. Dilated.                           - Mildly  severe reflux esophagitis with no                            bleeding. Biopsied.                           - No gross lesions in the stomach. Biopsied.                           - Normal duodenal bulb, first portion of the                            duodenum and second portion of the duodenum. Moderate Sedation:      Moderate (conscious) sedation was personally administered by an       anesthesia professional. The following parameters were monitored: oxygen       saturation, heart rate, blood pressure, and response to care. Recommendation:           -  Patient has a contact number available for                            emergencies. The signs and symptoms of potential                            delayed complications were discussed with the                            patient. Return to normal activities tomorrow.                            Written discharge instructions were provided to the                            patient.                           - Resume previous diet.                           - Continue present medications.                           - Await pathology results.                           - Return to my office in 3 months. Procedure Code(s):        --- Professional ---                           [phone], Esophagogastroduodenoscopy, flexible,                            transoral; with transendoscopic balloon dilation of                            esophagus (less than 30 mm diameter)  19417, 59, Esophagogastroduodenoscopy, flexible,                            transoral; with biopsy, single or multiple Diagnosis Code(s):        --- Professional ---                           K22.4, Dyskinesia of esophagus                           K21.00, Gastro-esophageal reflux disease with                            esophagitis, without bleeding                           R13.10, Dysphagia, unspecified                           R93.3, Abnormal findings on diagnostic imaging of                            other parts of digestive tract CPT copyright 2019 American Medical Association. All rights reserved. The codes documented in this report are preliminary and upon coder review may  be revised to meet current compliance requirements. Otis Brace, MD Otis Brace, MD 03/04/2022 10:24:44 AM Number of Addenda: 0

## 2022-03-04 NOTE — Anesthesia Preprocedure Evaluation (Addendum)
Anesthesia Evaluation  Patient identified by MRN, date of birth, ID band Patient awake    Reviewed: Allergy & Precautions, NPO status , Patient's Chart, lab work & pertinent test results  Airway Mallampati: II  TM Distance: >3 FB Neck ROM: Full    Dental no notable dental hx. (+) Teeth Intact, Dental Advisory Given   Pulmonary neg pulmonary ROS,    Pulmonary exam normal breath sounds clear to auscultation       Cardiovascular hypertension, Pt. on medications Normal cardiovascular exam Rhythm:Regular Rate:Normal  HLD   Neuro/Psych PSYCHIATRIC DISORDERS Dementia negative neurological ROS     GI/Hepatic Neg liver ROS, GERD  Medicated and Controlled,  Endo/Other  negative endocrine ROS  Renal/GU negative Renal ROS  negative genitourinary   Musculoskeletal negative musculoskeletal ROS (+)   Abdominal   Peds  Hematology negative hematology ROS (+)   Anesthesia Other Findings   Reproductive/Obstetrics                            Anesthesia Physical Anesthesia Plan  ASA: 2  Anesthesia Plan: MAC   Post-op Pain Management:    Induction: Intravenous  PONV Risk Score and Plan: 2 and Propofol infusion and Treatment may vary due to age or medical condition  Airway Management Planned: Natural Airway  Additional Equipment:   Intra-op Plan:   Post-operative Plan:   Informed Consent: I have reviewed the patients History and Physical, chart, labs and discussed the procedure including the risks, benefits and alternatives for the proposed anesthesia with the patient or authorized representative who has indicated his/her understanding and acceptance.     Dental advisory given  Plan Discussed with: CRNA  Anesthesia Plan Comments:         Anesthesia Quick Evaluation

## 2022-03-05 ENCOUNTER — Encounter (HOSPITAL_COMMUNITY): Payer: Self-pay | Admitting: Gastroenterology

## 2022-03-08 LAB — SURGICAL PATHOLOGY

## 2022-03-31 DIAGNOSIS — Z6827 Body mass index (BMI) 27.0-27.9, adult: Secondary | ICD-10-CM | POA: Diagnosis not present

## 2022-03-31 DIAGNOSIS — M25561 Pain in right knee: Secondary | ICD-10-CM | POA: Diagnosis not present

## 2022-05-03 DIAGNOSIS — J069 Acute upper respiratory infection, unspecified: Secondary | ICD-10-CM | POA: Diagnosis not present

## 2022-05-03 DIAGNOSIS — Z6827 Body mass index (BMI) 27.0-27.9, adult: Secondary | ICD-10-CM | POA: Diagnosis not present

## 2022-05-03 DIAGNOSIS — R051 Acute cough: Secondary | ICD-10-CM | POA: Diagnosis not present

## 2022-06-01 DIAGNOSIS — H9193 Unspecified hearing loss, bilateral: Secondary | ICD-10-CM | POA: Diagnosis not present

## 2022-06-01 DIAGNOSIS — Z6828 Body mass index (BMI) 28.0-28.9, adult: Secondary | ICD-10-CM | POA: Diagnosis not present

## 2022-06-01 DIAGNOSIS — N1831 Chronic kidney disease, stage 3a: Secondary | ICD-10-CM | POA: Diagnosis not present

## 2022-06-01 DIAGNOSIS — R413 Other amnesia: Secondary | ICD-10-CM | POA: Diagnosis not present

## 2022-06-01 DIAGNOSIS — H6993 Unspecified Eustachian tube disorder, bilateral: Secondary | ICD-10-CM | POA: Diagnosis not present

## 2022-07-05 DIAGNOSIS — N3001 Acute cystitis with hematuria: Secondary | ICD-10-CM | POA: Diagnosis not present

## 2022-07-12 DIAGNOSIS — M5441 Lumbago with sciatica, right side: Secondary | ICD-10-CM | POA: Diagnosis not present

## 2022-07-12 DIAGNOSIS — J301 Allergic rhinitis due to pollen: Secondary | ICD-10-CM | POA: Diagnosis not present

## 2022-07-12 DIAGNOSIS — Z6827 Body mass index (BMI) 27.0-27.9, adult: Secondary | ICD-10-CM | POA: Diagnosis not present

## 2022-07-12 DIAGNOSIS — N3001 Acute cystitis with hematuria: Secondary | ICD-10-CM | POA: Diagnosis not present

## 2022-08-03 DIAGNOSIS — M545 Low back pain, unspecified: Secondary | ICD-10-CM | POA: Diagnosis not present

## 2022-08-03 DIAGNOSIS — M5441 Lumbago with sciatica, right side: Secondary | ICD-10-CM | POA: Diagnosis not present

## 2022-08-09 DIAGNOSIS — M545 Low back pain, unspecified: Secondary | ICD-10-CM | POA: Diagnosis not present

## 2022-08-09 DIAGNOSIS — M5441 Lumbago with sciatica, right side: Secondary | ICD-10-CM | POA: Diagnosis not present

## 2022-08-10 DIAGNOSIS — K219 Gastro-esophageal reflux disease without esophagitis: Secondary | ICD-10-CM | POA: Diagnosis not present

## 2022-08-12 DIAGNOSIS — M5441 Lumbago with sciatica, right side: Secondary | ICD-10-CM | POA: Diagnosis not present

## 2022-08-12 DIAGNOSIS — M545 Low back pain, unspecified: Secondary | ICD-10-CM | POA: Diagnosis not present

## 2022-08-17 DIAGNOSIS — M5441 Lumbago with sciatica, right side: Secondary | ICD-10-CM | POA: Diagnosis not present

## 2022-08-24 DIAGNOSIS — M5441 Lumbago with sciatica, right side: Secondary | ICD-10-CM | POA: Diagnosis not present

## 2022-08-26 DIAGNOSIS — M5441 Lumbago with sciatica, right side: Secondary | ICD-10-CM | POA: Diagnosis not present

## 2022-08-30 DIAGNOSIS — M5441 Lumbago with sciatica, right side: Secondary | ICD-10-CM | POA: Diagnosis not present

## 2022-09-02 DIAGNOSIS — M5441 Lumbago with sciatica, right side: Secondary | ICD-10-CM | POA: Diagnosis not present

## 2022-09-12 DIAGNOSIS — M5441 Lumbago with sciatica, right side: Secondary | ICD-10-CM | POA: Diagnosis not present

## 2022-09-16 DIAGNOSIS — M5441 Lumbago with sciatica, right side: Secondary | ICD-10-CM | POA: Diagnosis not present

## 2022-09-30 DIAGNOSIS — G4700 Insomnia, unspecified: Secondary | ICD-10-CM | POA: Diagnosis not present

## 2022-09-30 DIAGNOSIS — E7439 Other disorders of intestinal carbohydrate absorption: Secondary | ICD-10-CM | POA: Diagnosis not present

## 2022-09-30 DIAGNOSIS — Z1331 Encounter for screening for depression: Secondary | ICD-10-CM | POA: Diagnosis not present

## 2022-09-30 DIAGNOSIS — Z0000 Encounter for general adult medical examination without abnormal findings: Secondary | ICD-10-CM | POA: Diagnosis not present

## 2022-09-30 DIAGNOSIS — E785 Hyperlipidemia, unspecified: Secondary | ICD-10-CM | POA: Diagnosis not present

## 2022-09-30 DIAGNOSIS — I10 Essential (primary) hypertension: Secondary | ICD-10-CM | POA: Diagnosis not present

## 2022-09-30 DIAGNOSIS — N1831 Chronic kidney disease, stage 3a: Secondary | ICD-10-CM | POA: Diagnosis not present

## 2022-09-30 DIAGNOSIS — Z79899 Other long term (current) drug therapy: Secondary | ICD-10-CM | POA: Diagnosis not present

## 2022-09-30 DIAGNOSIS — R413 Other amnesia: Secondary | ICD-10-CM | POA: Diagnosis not present

## 2022-11-13 ENCOUNTER — Emergency Department (HOSPITAL_COMMUNITY)
Admission: EM | Admit: 2022-11-13 | Discharge: 2022-11-13 | Disposition: A | Discharge status: Home / Self Care | Payer: Medicare HMO | Attending: Emergency Medicine | Admitting: Emergency Medicine

## 2022-11-13 ENCOUNTER — Emergency Department (HOSPITAL_COMMUNITY): Payer: Medicare HMO

## 2022-11-13 ENCOUNTER — Encounter (HOSPITAL_COMMUNITY): Payer: Self-pay

## 2022-11-13 DIAGNOSIS — R55 Syncope and collapse: Secondary | ICD-10-CM | POA: Diagnosis not present

## 2022-11-13 DIAGNOSIS — E876 Hypokalemia: Secondary | ICD-10-CM | POA: Insufficient documentation

## 2022-11-13 DIAGNOSIS — I10 Essential (primary) hypertension: Secondary | ICD-10-CM | POA: Diagnosis not present

## 2022-11-13 DIAGNOSIS — S0990XA Unspecified injury of head, initial encounter: Secondary | ICD-10-CM | POA: Diagnosis not present

## 2022-11-13 LAB — BASIC METABOLIC PANEL
Anion gap: 8 (ref 5–15)
BUN: 15 mg/dL (ref 8–23)
CO2: 25 mmol/L (ref 22–32)
Calcium: 8.9 mg/dL (ref 8.9–10.3)
Chloride: 107 mmol/L (ref 98–111)
Creatinine, Ser: 0.94 mg/dL (ref 0.44–1.00)
GFR, Estimated: >60 mL/min (ref >60)
Glucose, Bld: 92 mg/dL (ref 70–99)
Potassium: 3 mmol/L — ABNORMAL LOW (ref 3.5–5.1)
Sodium: 140 mmol/L (ref 135–145)

## 2022-11-13 LAB — CBC WITH DIFFERENTIAL/PLATELET
Abs Immature Granulocytes: 0.03 10*3/uL (ref 0.00–0.07)
Basophils Absolute: 0.1 10*3/uL (ref 0.0–0.1)
Basophils Relative: 1 %
Eosinophils Absolute: 0.2 10*3/uL (ref 0.0–0.5)
Eosinophils Relative: 5 %
HCT: 39.8 % (ref 36.0–46.0)
Hemoglobin: 12.8 g/dL (ref 12.0–15.0)
Immature Granulocytes: 1 %
Lymphocytes Relative: 39 %
Lymphs Abs: 1.9 10*3/uL (ref 0.7–4.0)
MCH: 28.5 pg (ref 26.0–34.0)
MCHC: 32.2 g/dL (ref 30.0–36.0)
MCV: 88.6 fL (ref 80.0–100.0)
Monocytes Absolute: 0.4 10*3/uL (ref 0.1–1.0)
Monocytes Relative: 8 %
Neutro Abs: 2.2 10*3/uL (ref 1.7–7.7)
Neutrophils Relative %: 46 %
Platelets: 179 10*3/uL (ref 150–400)
RBC: 4.49 MIL/uL (ref 3.87–5.11)
RDW: 14 % (ref 11.5–15.5)
WBC: 4.8 10*3/uL (ref 4.0–10.5)
nRBC: 0 % (ref 0.0–0.2)

## 2022-11-13 LAB — URINALYSIS, ROUTINE W REFLEX MICROSCOPIC
Bilirubin Urine: NEGATIVE
Glucose, UA: NEGATIVE mg/dL
Hgb urine dipstick: NEGATIVE
Ketones, ur: NEGATIVE mg/dL
Nitrite: NEGATIVE
Protein, ur: NEGATIVE mg/dL
Specific Gravity, Urine: 1.021 (ref 1.005–1.030)
pH: 5 (ref 5.0–8.0)

## 2022-11-13 LAB — CBG MONITORING, ED: Glucose-Capillary: 86 mg/dL (ref 70–99)

## 2022-11-13 MED ORDER — POTASSIUM CHLORIDE 20 MEQ PO PACK
60.0000 meq | PACK | Freq: Once | ORAL | Status: AC
  Administered 2022-11-13: 60 meq via ORAL
  Filled 2022-11-13: qty 3

## 2022-11-13 NOTE — ED Triage Notes (Signed)
Pt comes via [facility] for syncopal episode tonight, hit head, no injuries from fall

## 2022-11-13 NOTE — ED Provider Notes (Signed)
[place] EMERGENCY DEPARTMENT AT [facility] Provider Note   CSN: [id number] Arrival date & time: 11/13/22  0515     History  Chief Complaint  Patient presents with   Loss of Consciousness    Kelsey Soto is a 72 y.o. female.  72 year old female with past medical history of hypertension, hypokalemia, hyperlipidemia, short-term memory loss, overactive bladder presents via EMS with concern for syncopal episode.  Patient states that she lives alone, her boyfriend was at her house with her last night and she was told that she passed out.  Patient is unable to recall any of the events, is unsure if she had gone to bed for night and got up or for what reason she passed out.  States that her boyfriend told her that she hit her head when she fell.  She is not anticoagulated.  She is feeling well at this time other than feeling a little bit lightheaded.  Denies recent illness.  No other complaints or concerns tonight.  Patient significant other arrives at the bedside, states that he woke up with a cramp in the patient went to get some mustard for him.  On her way back to the boyfriend she "" dropped" and hit her head on the humidifier.  He states that her body was stiff for a brief moment and after that she seemed confused asking what happened.  When this story is told in front of the patient, she does recall going to get him some mustard but does not recall any other details.       Home Medications Prior to Admission medications   Medication Sig Start Date End Date Taking? Authorizing Provider  amitriptyline (ELAVIL) 10 MG tablet Take 40 mg by mouth at bedtime. 09/07/21   [provider]  aspirin EC 81 MG tablet Take 81 mg by mouth daily.    [provider]  esomeprazole (NEXIUM) 40 MG capsule Take 40 mg by mouth 2 (two) times daily before a meal. 07/01/21   [provider]  hydrochlorothiazide (HYDRODIURIL) 25 MG tablet Take 25 mg by mouth daily. 10/16/21    [provider]  losartan (COZAAR) 25 MG tablet Take 25 mg by mouth daily. 09/07/21   [provider]  memantine (NAMENDA) 10 MG tablet Take 10 mg by mouth 2 (two) times daily. 08/09/21   [provider]  mometasone (NASONEX) 50 MCG/ACT nasal spray Place 1 spray into the nose daily as needed (allergies). 09/20/21   [provider]  OVER THE COUNTER MEDICATION Take 1 capsule by mouth daily. Liver Renew    [provider]  oxybutynin (DITROPAN) 5 MG tablet Take 5 mg by mouth daily. 10/21/21   [provider]  potassium chloride (KLOR-CON M) 10 MEQ tablet Take 10 mEq by mouth daily. 10/13/21   [provider]      Allergies    Patient has no known allergies.    Review of Systems   Review of Systems Negative except as per HPI Physical Exam Updated Vital Signs BP 124/72   Pulse 69   Temp 97.8 F (36.6 C) (Oral)   Resp 18   Ht 5\' 2"  (1.575 m)   SpO2 100%   BMI 26.89 kg/m  Physical Exam Vitals and nursing note reviewed.  Constitutional:      General: She is not in acute distress.    Appearance: She is well-developed. She is not diaphoretic.  HENT:     Head: Normocephalic and atraumatic.  Nose: Nose normal.     Mouth/Throat:     Mouth: Mucous membranes are moist.  Eyes:     Extraocular Movements: Extraocular movements intact.     Pupils: Pupils are equal, round, and reactive to light.  Cardiovascular:     Rate and Rhythm: Normal rate and regular rhythm.     Pulses: Normal pulses.     Heart sounds: Normal heart sounds.  Pulmonary:     Effort: Pulmonary effort is normal.     Breath sounds: Normal breath sounds.  Abdominal:     Palpations: Abdomen is soft.     Tenderness: There is no abdominal tenderness.  Musculoskeletal:     Cervical back: Normal range of motion and neck supple. No tenderness or bony tenderness.     Thoracic back: No tenderness or bony tenderness.     Lumbar back: No tenderness or bony  tenderness.     Comments: No pain with range of motion of the upper and lower extremities  Skin:    General: Skin is warm and dry.     Findings: No erythema or rash.  Neurological:     Mental Status: She is alert and oriented to person, place, and time.     Cranial Nerves: No cranial nerve deficit.     Sensory: No sensory deficit.     Motor: No weakness.  Psychiatric:        Behavior: Behavior normal.     ED Results / Procedures / Treatments   Labs (all labs ordered are listed, but only abnormal results are displayed) Labs Reviewed  CBC WITH DIFFERENTIAL/PLATELET  BASIC METABOLIC PANEL  URINALYSIS, ROUTINE W REFLEX MICROSCOPIC  CBG MONITORING, ED    EKG None  Radiology CT Head Wo Contrast  Result Date: 11/13/2022 CLINICAL DATA:  72 year old female with syncope, fall, head injury. EXAM: CT HEAD WITHOUT CONTRAST TECHNIQUE: Contiguous axial images were obtained from the base of the skull through the vertex without intravenous contrast. RADIATION DOSE REDUCTION: This exam was performed according to the departmental dose-optimization program which includes automated exposure control, adjustment of the mA and/or kV according to patient size and/or use of iterative reconstruction technique. COMPARISON:  Head CT 10/27/2021. FINDINGS: Brain: Cerebral volume is within normal limits for age. No midline shift, ventriculomegaly, mass effect, evidence of mass lesion, intracranial hemorrhage or evidence of cortically based acute infarction. Gray-white matter differentiation is stable from last year and within normal limits for age. Vascular: Mild Calcified atherosclerosis at the skull base. No suspicious intracranial vascular hyperdensity. Skull: No acute osseous abnormality identified. Sinuses/Orbits: Visualized paranasal sinuses and mastoids are clear. Other: No acute orbit or scalp soft tissue finding. IMPRESSION: No acute intracranial abnormality or acute traumatic injury identified. Stable and  normal for age noncontrast CT appearance of the brain. Electronically Signed   By: Odessa Fleming M.D.   On: 11/13/2022 06:32    Procedures Procedures    Medications Ordered in ED Medications - No data to display  ED Course/ Medical Decision Making/ A&P                             Medical Decision Making Amount and/or Complexity of Data Reviewed Labs: ordered. Radiology: ordered. ECG/medicine tests: ordered.   This patient presents to the ED for concern of syncope, this involves an extensive number of treatment options, and is a complaint that carries with it a high risk of complications and morbidity.  The differential diagnosis  includes but not limited to arrhythmia, intracranial injury, UTI, metabolic abnormality    Co morbidities that complicate the patient evaluation  Hypertension, hypokalemia, insomnia, hyperlipidemia, short-term memory loss, overactive bladder, goiter, dysphagia   Additional history obtained:  Additional history obtained from EMS who provides history as above Significant other who contributes to history as above. External records from outside source obtained and reviewed including prior labs on file for comparison   Lab Tests:  I Ordered, and personally interpreted labs.  The pertinent results include: CBC within normal limits.  CBG normal.  BMP and UA pending at time of signout to oncoming provider.   Imaging Studies ordered:  I ordered imaging studies including CT head I independently visualized and interpreted imaging which showed no acute abnormality I agree with the radiologist interpretation   Consultations Obtained:  I requested consultation with the Dr. Blinda Leatherwood, ER attending,  and discussed lab and imaging findings as well as pertinent plan - they recommend: check EKG, agrees with plan of care   Problem List / ED Course / Critical interventions / Medication management  72 year old female brought in by EMS from home where her boyfriend  called after she had a syncopal episode.  Patient does not recall events around this, denies complaints other than feeling lightheaded at this time.  She is alert and oriented to person, place, date.  Exam is unremarkable, able to move all extremities without difficulty, no unilateral weakness or numbness, no pain in the neck or back. I have reviewed the patients home medicines and have made adjustments as needed   Social Determinants of Health:  Lives alone   Test / Admission - Considered:  Disposition pending at time of signout to oncoming provider         Final Clinical Impression(s) / ED Diagnoses Final diagnoses:  Syncope and collapse    Rx / DC Orders ED Discharge Orders     None         Jeannie Fend, PA-C 11/13/22 1610    Gilda Crease, MD 11/13/22 [phone]

## 2022-11-13 NOTE — Discharge Instructions (Signed)
Thank you for letting us take care of you today.  Overall, your workup was reassuring. Your potassium was low but I know you have had this before. Continue your potassium supplementation at home.  I do think you should follow up with cardiology to discuss the syncopal episode from last night. Call your cardiologist to schedule this appointment. I also recommend following up with neurology. I provided our on call neurologist information. Please call their office and schedule a follow up. I recommend not driving until cleared by these providers or your PCP to prevent any accidents or injuries.  For any new or worsening symptoms including repeat syncopal episodes, please return to nearest ED for re-evaluation.

## 2022-11-13 NOTE — ED Notes (Signed)
Pt ambulated to restroom with stand by assistance

## 2022-11-13 NOTE — ED Notes (Signed)
Discharge instructions discussed with pt. Verbalized understanding. VSS. No questions or concerns regarding discharge  

## 2022-11-13 NOTE — ED Provider Notes (Signed)
Accepted handoff at shift change from Army Melia, PA-C. Please see prior provider note for more detail.   Briefly: Patient is 72 y.o. presenting to the ED following syncopal episode while walking at home last night.  She fell and hit her head on a humidifier.  Boyfriend at bedside states that he noticed patient's body "tightening up" for a brief moment and patient was briefly confused after the event.  Patient has no recollection of this.  She does remember events preceding syncopal event.  She has not had any recent illness and prior to this was in her typical state of health.  CT head negative.  Currently complains of feeling lightheaded but no other complaints.  Plan: Pending EKG and urinalysis with anticipated discharge home with ambulatory referral if remainder of workup is negative.  Ambulatory referral to cardiology ordered.    Reevaluation at 0737 -   Orthostatic Lying BP- Lying: 130/63 Pulse- Lying: 64 Orthostatic Sitting BP- Sitting: 132/77 Pulse- Sitting: 72 Orthostatic Standing at 0 minutes BP- Standing at 0 minutes: 127/73 Pulse- Standing at 0 minutes: 87 Orthostatic Standing at 3 minutes BP- Standing at 3 minutes: 128/70 Pulse- Standing at 3 minutes: 74   Pt ambulatory in ED unassisted without signs of difficulty or distress. Pending UA and anticipated discharge. Hypokalemia at 3, repleted. EKG NSR at 68bpm, no acute ST-T changes, no STEMI. Pt remains stable in no acute distress.   Reevaluation at 0810 -  Pt resting comfortably in bed. Asymptomatic. Confirms story from last night. UA negative. Has history of hypokalemia on daily repletion. Has been seen by cardiology within the last year. Will have pt follow up with primary cardiologist for further outpatient evaluation of syncope. Will also have her follow up with neurology. Instructed not to drive until cleared by outpatient providers and pt agreeable. All questions answered, strict ED return precautions given, and stable  for discharge.    Richardson Dopp 11/13/22 1610    Pricilla Loveless, MD 11/13/22 [phone]

## 2022-12-05 DIAGNOSIS — Z1231 Encounter for screening mammogram for malignant neoplasm of breast: Secondary | ICD-10-CM | POA: Diagnosis not present

## 2022-12-06 ENCOUNTER — Other Ambulatory Visit: Payer: Self-pay | Admitting: Cardiology

## 2022-12-06 DIAGNOSIS — R0609 Other forms of dyspnea: Secondary | ICD-10-CM

## 2022-12-06 DIAGNOSIS — R9431 Abnormal electrocardiogram [ECG] [EKG]: Secondary | ICD-10-CM

## 2022-12-06 DIAGNOSIS — I10 Essential (primary) hypertension: Secondary | ICD-10-CM

## 2023-01-06 DIAGNOSIS — K219 Gastro-esophageal reflux disease without esophagitis: Secondary | ICD-10-CM | POA: Diagnosis not present

## 2023-01-06 DIAGNOSIS — N1831 Chronic kidney disease, stage 3a: Secondary | ICD-10-CM | POA: Diagnosis not present

## 2023-01-06 DIAGNOSIS — Z6825 Body mass index (BMI) 25.0-25.9, adult: Secondary | ICD-10-CM | POA: Diagnosis not present

## 2023-01-06 DIAGNOSIS — E785 Hyperlipidemia, unspecified: Secondary | ICD-10-CM | POA: Diagnosis not present

## 2023-01-06 DIAGNOSIS — R413 Other amnesia: Secondary | ICD-10-CM | POA: Diagnosis not present

## 2023-01-06 DIAGNOSIS — R7302 Impaired glucose tolerance (oral): Secondary | ICD-10-CM | POA: Diagnosis not present

## 2023-01-17 DIAGNOSIS — G319 Degenerative disease of nervous system, unspecified: Secondary | ICD-10-CM | POA: Diagnosis not present

## 2023-01-17 DIAGNOSIS — R413 Other amnesia: Secondary | ICD-10-CM | POA: Diagnosis not present

## 2023-01-24 DIAGNOSIS — R413 Other amnesia: Secondary | ICD-10-CM | POA: Diagnosis not present

## 2023-01-24 DIAGNOSIS — I10 Essential (primary) hypertension: Secondary | ICD-10-CM | POA: Diagnosis not present

## 2023-01-24 DIAGNOSIS — Z6825 Body mass index (BMI) 25.0-25.9, adult: Secondary | ICD-10-CM | POA: Diagnosis not present

## 2023-03-27 DIAGNOSIS — N1831 Chronic kidney disease, stage 3a: Secondary | ICD-10-CM | POA: Diagnosis not present

## 2023-03-27 DIAGNOSIS — E876 Hypokalemia: Secondary | ICD-10-CM | POA: Diagnosis not present

## 2023-03-27 DIAGNOSIS — R413 Other amnesia: Secondary | ICD-10-CM | POA: Diagnosis not present

## 2023-03-27 DIAGNOSIS — Z6825 Body mass index (BMI) 25.0-25.9, adult: Secondary | ICD-10-CM | POA: Diagnosis not present

## 2023-04-28 DIAGNOSIS — Z6825 Body mass index (BMI) 25.0-25.9, adult: Secondary | ICD-10-CM | POA: Diagnosis not present

## 2023-04-28 DIAGNOSIS — Z1339 Encounter for screening examination for other mental health and behavioral disorders: Secondary | ICD-10-CM | POA: Diagnosis not present

## 2023-04-28 DIAGNOSIS — J029 Acute pharyngitis, unspecified: Secondary | ICD-10-CM | POA: Diagnosis not present

## 2023-04-28 DIAGNOSIS — R413 Other amnesia: Secondary | ICD-10-CM | POA: Diagnosis not present

## 2023-04-28 DIAGNOSIS — E876 Hypokalemia: Secondary | ICD-10-CM | POA: Diagnosis not present

## 2023-05-11 DIAGNOSIS — M542 Cervicalgia: Secondary | ICD-10-CM | POA: Diagnosis not present

## 2023-05-11 DIAGNOSIS — M62838 Other muscle spasm: Secondary | ICD-10-CM | POA: Diagnosis not present

## 2023-05-11 DIAGNOSIS — Z6825 Body mass index (BMI) 25.0-25.9, adult: Secondary | ICD-10-CM | POA: Diagnosis not present

## 2023-05-12 DIAGNOSIS — M47812 Spondylosis without myelopathy or radiculopathy, cervical region: Secondary | ICD-10-CM | POA: Diagnosis not present

## 2023-05-12 DIAGNOSIS — S29012A Strain of muscle and tendon of back wall of thorax, initial encounter: Secondary | ICD-10-CM | POA: Diagnosis not present

## 2023-05-12 DIAGNOSIS — I10 Essential (primary) hypertension: Secondary | ICD-10-CM | POA: Diagnosis not present

## 2023-05-12 DIAGNOSIS — X500XXA Overexertion from strenuous movement or load, initial encounter: Secondary | ICD-10-CM | POA: Diagnosis not present

## 2023-05-12 DIAGNOSIS — T148XXA Other injury of unspecified body region, initial encounter: Secondary | ICD-10-CM | POA: Diagnosis not present

## 2023-05-12 DIAGNOSIS — S29019A Strain of muscle and tendon of unspecified wall of thorax, initial encounter: Secondary | ICD-10-CM | POA: Diagnosis not present

## 2023-05-19 DIAGNOSIS — M62838 Other muscle spasm: Secondary | ICD-10-CM | POA: Diagnosis not present

## 2023-05-19 DIAGNOSIS — Z6826 Body mass index (BMI) 26.0-26.9, adult: Secondary | ICD-10-CM | POA: Diagnosis not present

## 2023-05-19 DIAGNOSIS — F319 Bipolar disorder, unspecified: Secondary | ICD-10-CM | POA: Diagnosis not present

## 2023-05-19 DIAGNOSIS — G542 Cervical root disorders, not elsewhere classified: Secondary | ICD-10-CM | POA: Diagnosis not present

## 2023-05-23 ENCOUNTER — Ambulatory Visit: Payer: Medicare HMO | Admitting: Cardiology

## 2023-06-01 ENCOUNTER — Ambulatory Visit: Payer: Medicare HMO | Admitting: Cardiology

## 2023-06-09 DIAGNOSIS — M542 Cervicalgia: Secondary | ICD-10-CM | POA: Diagnosis not present

## 2023-06-12 DIAGNOSIS — M542 Cervicalgia: Secondary | ICD-10-CM | POA: Diagnosis not present

## 2023-07-12 ENCOUNTER — Ambulatory Visit: Payer: Medicare HMO | Attending: Cardiology | Admitting: Cardiology

## 2023-08-21 DIAGNOSIS — F03A0 Unspecified dementia, mild, without behavioral disturbance, psychotic disturbance, mood disturbance, and anxiety: Secondary | ICD-10-CM | POA: Diagnosis not present

## 2023-08-21 DIAGNOSIS — F319 Bipolar disorder, unspecified: Secondary | ICD-10-CM | POA: Diagnosis not present

## 2023-08-21 DIAGNOSIS — Z6826 Body mass index (BMI) 26.0-26.9, adult: Secondary | ICD-10-CM | POA: Diagnosis not present

## 2023-08-21 DIAGNOSIS — N1831 Chronic kidney disease, stage 3a: Secondary | ICD-10-CM | POA: Diagnosis not present

## 2023-09-05 DIAGNOSIS — M542 Cervicalgia: Secondary | ICD-10-CM | POA: Diagnosis not present

## 2023-09-05 DIAGNOSIS — M533 Sacrococcygeal disorders, not elsewhere classified: Secondary | ICD-10-CM | POA: Diagnosis not present

## 2023-09-05 DIAGNOSIS — M25519 Pain in unspecified shoulder: Secondary | ICD-10-CM | POA: Diagnosis not present

## 2023-09-20 DIAGNOSIS — M2560 Stiffness of unspecified joint, not elsewhere classified: Secondary | ICD-10-CM | POA: Diagnosis not present

## 2023-09-20 DIAGNOSIS — M542 Cervicalgia: Secondary | ICD-10-CM | POA: Diagnosis not present

## 2023-10-04 DIAGNOSIS — Z13820 Encounter for screening for osteoporosis: Secondary | ICD-10-CM | POA: Diagnosis not present

## 2023-10-04 DIAGNOSIS — R7302 Impaired glucose tolerance (oral): Secondary | ICD-10-CM | POA: Diagnosis not present

## 2023-10-04 DIAGNOSIS — N1831 Chronic kidney disease, stage 3a: Secondary | ICD-10-CM | POA: Diagnosis not present

## 2023-10-04 DIAGNOSIS — Z1331 Encounter for screening for depression: Secondary | ICD-10-CM | POA: Diagnosis not present

## 2023-10-04 DIAGNOSIS — Z0000 Encounter for general adult medical examination without abnormal findings: Secondary | ICD-10-CM | POA: Diagnosis not present

## 2023-10-04 DIAGNOSIS — E785 Hyperlipidemia, unspecified: Secondary | ICD-10-CM | POA: Diagnosis not present

## 2023-10-04 DIAGNOSIS — M8589 Other specified disorders of bone density and structure, multiple sites: Secondary | ICD-10-CM | POA: Diagnosis not present

## 2023-10-04 DIAGNOSIS — Z6826 Body mass index (BMI) 26.0-26.9, adult: Secondary | ICD-10-CM | POA: Diagnosis not present

## 2023-10-04 DIAGNOSIS — I10 Essential (primary) hypertension: Secondary | ICD-10-CM | POA: Diagnosis not present

## 2023-10-04 DIAGNOSIS — Z79899 Other long term (current) drug therapy: Secondary | ICD-10-CM | POA: Diagnosis not present

## 2023-10-04 DIAGNOSIS — Z1339 Encounter for screening examination for other mental health and behavioral disorders: Secondary | ICD-10-CM | POA: Diagnosis not present

## 2023-10-04 DIAGNOSIS — E876 Hypokalemia: Secondary | ICD-10-CM | POA: Diagnosis not present

## 2023-10-28 DIAGNOSIS — R300 Dysuria: Secondary | ICD-10-CM | POA: Diagnosis not present

## 2023-10-28 DIAGNOSIS — N3091 Cystitis, unspecified with hematuria: Secondary | ICD-10-CM | POA: Diagnosis not present

## 2023-11-10 DIAGNOSIS — Z6825 Body mass index (BMI) 25.0-25.9, adult: Secondary | ICD-10-CM | POA: Diagnosis not present

## 2023-11-10 DIAGNOSIS — R413 Other amnesia: Secondary | ICD-10-CM | POA: Diagnosis not present

## 2023-12-07 DIAGNOSIS — Z1231 Encounter for screening mammogram for malignant neoplasm of breast: Secondary | ICD-10-CM | POA: Diagnosis not present

## 2023-12-15 DIAGNOSIS — K219 Gastro-esophageal reflux disease without esophagitis: Secondary | ICD-10-CM | POA: Diagnosis not present

## 2023-12-15 DIAGNOSIS — R1084 Generalized abdominal pain: Secondary | ICD-10-CM | POA: Diagnosis not present

## 2023-12-15 DIAGNOSIS — Z6825 Body mass index (BMI) 25.0-25.9, adult: Secondary | ICD-10-CM | POA: Diagnosis not present

## 2023-12-15 DIAGNOSIS — F03A0 Unspecified dementia, mild, without behavioral disturbance, psychotic disturbance, mood disturbance, and anxiety: Secondary | ICD-10-CM | POA: Diagnosis not present

## 2024-01-04 ENCOUNTER — Other Ambulatory Visit: Payer: Self-pay

## 2024-01-04 ENCOUNTER — Other Ambulatory Visit: Payer: Self-pay | Admitting: Pharmacist

## 2024-01-04 DIAGNOSIS — E876 Hypokalemia: Secondary | ICD-10-CM | POA: Diagnosis not present

## 2024-01-04 DIAGNOSIS — N1831 Chronic kidney disease, stage 3a: Secondary | ICD-10-CM | POA: Diagnosis not present

## 2024-01-04 DIAGNOSIS — I10 Essential (primary) hypertension: Secondary | ICD-10-CM | POA: Diagnosis not present

## 2024-01-04 DIAGNOSIS — Z6825 Body mass index (BMI) 25.0-25.9, adult: Secondary | ICD-10-CM | POA: Diagnosis not present

## 2024-01-04 DIAGNOSIS — K219 Gastro-esophageal reflux disease without esophagitis: Secondary | ICD-10-CM | POA: Diagnosis not present

## 2024-01-04 DIAGNOSIS — G4700 Insomnia, unspecified: Secondary | ICD-10-CM | POA: Diagnosis not present

## 2024-01-04 NOTE — Progress Notes (Signed)
 01/04/2024 Name: Kelsey Soto MRN: [id number] DOB: 05-Apr-1951  Chief Complaint  Patient presents with   Medication Management    PATRICE MATTHEW is a 73 y.o. year old female who was referred for medication management by their primary care provider, Clemmie Nest, MD. They presented for a face to face visit today.   They were referred to the pharmacist by their PCP for assistance in managing complex medication management    Subjective:  Care Team: Primary Care Provider: Clemmie Nest, MD ; Next Scheduled Visit: to follow up in 6 weeks   Medication Access/Adherence  Current Pharmacy:  [id number] - [place], [place] - [address] [facility] [place] [address] Phone: [phone] Fax: [phone]  [facility] - [place], [place] - [address] [place] [address] Phone: [phone] Fax: [phone]   Patient reports affordability concerns with their medications: No  Patient reports access/transportation concerns to their pharmacy: No  Patient reports adherence concerns with their medications:  Yes  Interested in adherence packaging    Medication Management:  Current adherence strategy: Patient reports that her daughter comes by and places pills in a pill box for her.    Patient reports Fair adherence to medications  Patient reports the following barriers to adherence: Patient feels overwhelmed by the amount of medications she is prescribed  Recent fill dates:   Amitriptyline 10 mg taking 3 tablets PO HS filled 11/08/2023 #90  Esomeprazole 40 mg 1 capsule PO 30 minutes prior to evening meal #90 filled 12/30/2023 Hydrochlorothiazide 25 mg 1 tablet PO daily filled #90 01/02/2024 Klor-Con  M10 10 mEq 2 tablets PO daily filled #90 09/26/2023 Sucralfate 1 gram 1 tablet PO BID # 90 filled 10/26/2023   Objective:  Medications Reviewed Today     Reviewed by Romona Annabella Garre, RPH  (Pharmacist) on 01/04/24 at 1549  Med List Status: <None>   Medication Order Taking? Sig Documenting Provider Last Dose Status Informant  amitriptyline (ELAVIL) 10 MG tablet [id number] Yes Take 40 mg by mouth at bedtime.  Patient taking differently: Take 30 mg by mouth at bedtime.   [provider]  Active Self  aspirin EC 81 MG tablet [id number] Yes Take 81 mg by mouth daily. [provider]  Active Self    Discontinued 01/04/24 1453 (Change in therapy)          Med Note>> Romona Annabella Garre, [facility]   01/04/2024  2:53 PM Updating to reflect current regimen    esomeprazole (NEXIUM) 40 MG capsule [id number] Yes Take 40 mg by mouth daily with supper. [provider]  Active   hydrochlorothiazide (HYDRODIURIL) 25 MG tablet [id number] Yes Take 25 mg by mouth daily. [provider]  Active Self    Discontinued 01/04/24 1450 (Discontinued by provider)          Med Note>> Romona Annabella Garre, [facility]   01/04/2024  2:50 PM Discontinued at OV 01/04/2024 Dr. Burgart    meloxicam [facility]) 15 MG tablet [id number] Yes Take 15 mg by mouth daily. [provider]  Active     Discontinued 01/04/24 1450 (Discontinued by provider)          Med Note>> Romona Annabella Garre, [facility]   01/04/2024  2:50 PM Discontinued at OV 01/04/2024 Dr. Burgart    mometasone (NASONEX) 50 MCG/ACT nasal spray [id number] Yes Place 1 spray into the nose daily as needed (allergies). [provider]  Active Self  Discontinued 01/04/24 1450 (Completed Course)          Med Note>> Romona Annabella Garre, [facility]   01/04/2024  2:50 PM No longer taking      Discontinued 01/04/24 1450 (Discontinued by provider)          Med Note>> Romona Annabella Garre, [facility]   01/04/2024  2:50 PM Discontinued at OV 01/04/2024 Dr. Clemmie    potassium chloride  (KLOR-CON  M) 10 MEQ tablet [id number]  Take 20 mEq by mouth daily.  Patient not taking: Reported on 01/04/2024   [provider]  Active Self   sucralfate (CARAFATE) 1 g tablet [id number] Yes Take 1 g by mouth 2 (two) times daily. [provider]  Active               Assessment/Plan:   Medication Management: - Currently strategy insufficient to maintain appropriate adherence to prescribed medication regimen - Discussed collaboration with local pharmacies for adherence packaging. Reviewed local pharmacies with adherence packaging options. Patient elects to enroll with [facility].     Follow Up Plan: Follow up next week for further coordination, a few medications were filled recently which will delay pill packs.   Annabella Romona, PharmD Clinical Pharmacist [place] Direct Dial: [phone]

## 2024-01-05 ENCOUNTER — Other Ambulatory Visit (HOSPITAL_COMMUNITY): Payer: Self-pay

## 2024-01-06 ENCOUNTER — Other Ambulatory Visit (HOSPITAL_COMMUNITY): Payer: Self-pay

## 2024-01-06 MED ORDER — POTASSIUM CHLORIDE CRYS ER 10 MEQ PO TBCR
20.0000 meq | EXTENDED_RELEASE_TABLET | Freq: Every day | ORAL | 1 refills | Status: DC
  Filled 2024-01-06 – 2024-01-16 (×5): qty 60, 30d supply, fill #0
  Filled 2024-02-13: qty 60, 30d supply, fill #1

## 2024-01-06 MED ORDER — AMITRIPTYLINE HCL 10 MG PO TABS
30.0000 mg | ORAL_TABLET | Freq: Every evening | ORAL | 1 refills | Status: DC
  Filled 2024-01-06: qty 90, 90d supply, fill #0
  Filled 2024-01-09 – 2024-01-16 (×4): qty 90, 30d supply, fill #0
  Filled 2024-02-13: qty 90, 30d supply, fill #1

## 2024-01-06 MED ORDER — HYDROCHLOROTHIAZIDE 25 MG PO TABS
25.0000 mg | ORAL_TABLET | Freq: Every day | ORAL | 1 refills | Status: DC
  Filled 2024-01-06 – 2024-03-12 (×6): qty 30, 30d supply, fill #0
  Filled 2024-04-08: qty 30, 30d supply, fill #1

## 2024-01-06 MED ORDER — ESOMEPRAZOLE MAGNESIUM 40 MG PO CPDR
40.0000 mg | DELAYED_RELEASE_CAPSULE | Freq: Every evening | ORAL | 1 refills | Status: DC
  Filled 2024-01-06 – 2024-01-16 (×5): qty 30, 30d supply, fill #0

## 2024-01-06 MED ORDER — SUCRALFATE 1 G PO TABS
1.0000 g | ORAL_TABLET | Freq: Two times a day (BID) | ORAL | 1 refills | Status: DC
Start: 2024-01-04 — End: 2024-03-11
  Filled 2024-01-06 – 2024-01-16 (×5): qty 60, 30d supply, fill #0
  Filled 2024-02-13: qty 60, 30d supply, fill #1

## 2024-01-06 MED ORDER — ASPIRIN 81 MG PO TBEC
81.0000 mg | DELAYED_RELEASE_TABLET | Freq: Every day | ORAL | 1 refills | Status: DC
  Filled 2024-01-06 – 2024-03-12 (×7): qty 30, 30d supply, fill #0
  Filled 2024-04-08: qty 30, 30d supply, fill #1

## 2024-01-08 ENCOUNTER — Other Ambulatory Visit (HOSPITAL_COMMUNITY): Payer: Self-pay

## 2024-01-09 ENCOUNTER — Other Ambulatory Visit: Payer: Self-pay

## 2024-01-10 ENCOUNTER — Telehealth: Payer: Self-pay | Admitting: Pharmacist

## 2024-01-10 ENCOUNTER — Other Ambulatory Visit: Payer: Self-pay

## 2024-01-10 NOTE — Progress Notes (Signed)
 Attempted to contact patient for medication management re: medication adherence packaging. Left HIPAA compliant message for patient to return my call at their convenience.   [name], PharmD Clinical Pharmacist [place] Direct Dial: [phone]

## 2024-01-11 ENCOUNTER — Other Ambulatory Visit: Payer: Self-pay

## 2024-01-11 ENCOUNTER — Other Ambulatory Visit (HOSPITAL_COMMUNITY): Payer: Self-pay

## 2024-01-11 ENCOUNTER — Telehealth: Payer: Self-pay | Admitting: Pharmacist

## 2024-01-11 NOTE — Progress Notes (Signed)
   01/11/2024  Patient ID: Kelsey Soto, female   DOB: 03-21-1951, 73 y.o.   MRN: [id number]  Telephonic outreach to [name] today to discuss follow up on medication adherence packaging. Patient is still interested. Advised patient that two of her medications, esomeprazole  and hydrochlorothiazide  were just filled for #90 DS through [facility] pharmacy and are not eligible to be included in the packs until October.  Advised patient that she would need to take those two out of the bottle until October. Patient verbalized that she was ok with that. Reviewed costs and patient would like a day to think about it.   Follow Up: Call patient back 01/12/2024  [name], PharmD Clinical Pharmacist [place] Direct Dial: [phone]

## 2024-01-12 ENCOUNTER — Telehealth: Payer: Self-pay | Admitting: Pharmacist

## 2024-01-12 NOTE — Progress Notes (Signed)
 Attempted to contact patient for medication management re: medication adherence packaging. Unable to leave a voice message as mailbox was full.   [name], PharmD Clinical Pharmacist [place] Direct Dial: [phone]

## 2024-01-15 ENCOUNTER — Telehealth: Payer: Self-pay | Admitting: Pharmacist

## 2024-01-15 NOTE — Progress Notes (Signed)
 Attempted to contact patient for medication management re: adherence packaging. Unable to leave HIPAA compliant message as voice mail box is full.   [name], PharmD Clinical Pharmacist [place] Direct Dial: [phone]

## 2024-01-16 ENCOUNTER — Other Ambulatory Visit (HOSPITAL_COMMUNITY): Payer: Self-pay

## 2024-01-16 ENCOUNTER — Other Ambulatory Visit: Payer: Self-pay

## 2024-01-16 ENCOUNTER — Telehealth: Payer: Self-pay | Admitting: Pharmacist

## 2024-01-16 MED ORDER — PANTOPRAZOLE SODIUM 40 MG PO TBEC
40.0000 mg | DELAYED_RELEASE_TABLET | Freq: Every day | ORAL | 3 refills | Status: DC
  Filled 2024-01-16 (×2): qty 30, 30d supply, fill #0
  Filled 2024-02-13: qty 30, 30d supply, fill #1
  Filled 2024-03-08 – 2024-03-12 (×2): qty 30, 30d supply, fill #2
  Filled 2024-04-08: qty 30, 30d supply, fill #3

## 2024-01-16 NOTE — Progress Notes (Signed)
   01/16/2024  Patient ID: Kelsey Soto, female   DOB: [birth year], 72 y.o.   MRN: [id number]  Telephonic engagement with Mrs. Kelsey Soto (patient's designated emergency contact) regarding medication adherence packaging. Relayed information to Mrs. Kelsey Soto and she is agreeable to the pill packs. Informed that packaging will start today and delivery is scheduled for tomorrow.  Advised of costs and hydrochlorothiazide  will not be included in the packs until October. Kelsey Soto verbalized understanding. Kelsey Soto requests an updated copy of the patient's active medication list be placed into the patient portal for her to have. Will work with office to have this completed.   Kelsey Soto, PharmD Clinical Pharmacist [place] Direct Dial: [phone]

## 2024-01-16 NOTE — Progress Notes (Signed)
 Attempted to contact patient for medication management re: medication adherence packaging. Unable to contact patient. I have sent a web mail message to the patient through the [facility] EMR and left HIPAA compliant message for patient's authorized emergency contact to return my call at their convenience.   [name], PharmD Clinical Pharmacist [place] Direct Dial: [phone]

## 2024-01-17 ENCOUNTER — Other Ambulatory Visit: Payer: Self-pay

## 2024-01-19 ENCOUNTER — Other Ambulatory Visit: Payer: Self-pay

## 2024-01-24 DIAGNOSIS — Z6825 Body mass index (BMI) 25.0-25.9, adult: Secondary | ICD-10-CM | POA: Diagnosis not present

## 2024-01-24 DIAGNOSIS — R413 Other amnesia: Secondary | ICD-10-CM | POA: Diagnosis not present

## 2024-01-24 DIAGNOSIS — E876 Hypokalemia: Secondary | ICD-10-CM | POA: Diagnosis not present

## 2024-02-08 ENCOUNTER — Telehealth: Payer: Self-pay | Admitting: Pharmacist

## 2024-02-08 NOTE — Progress Notes (Signed)
   02/08/2024  Patient ID: Kelsey Soto, female   DOB: [birth year], 73 y.o.   MRN: [id number]   Attempted to reach Kelsey Soto by phone at the previously agreed on time of 1 PM. Attempted to reach patient again at 230 PM. Patient did not answer the phone and her voicemail box is full.  Placed call to patient's daughter, Kelsey Soto. Mrs. Sparks informs me that patient does not like the pill packs and prefers to take her medications out of the bottles. Kelsey Soto states that she and her niece will try to convince patient again this weekend. Patient has office visit follow up 02/12/2024.  Will follow up with provider, patient and daughter accordingly.   [name], PharmD Clinical Pharmacist [place] Direct Dial: [phone]

## 2024-02-08 NOTE — Progress Notes (Signed)
   02/08/2024  Patient ID: Kelsey Soto, female   DOB: 10-28-1950, 73 y.o.   MRN: [id number]  Telephonic outreach to patient to review medications for next round of pill packs. Patient is at [facility]. Will call patient back this afternoon.   [name], PharmD Clinical Pharmacist [place] Direct Dial: [phone]

## 2024-02-09 ENCOUNTER — Other Ambulatory Visit: Payer: Self-pay

## 2024-02-12 DIAGNOSIS — E876 Hypokalemia: Secondary | ICD-10-CM | POA: Diagnosis not present

## 2024-02-12 DIAGNOSIS — Z6825 Body mass index (BMI) 25.0-25.9, adult: Secondary | ICD-10-CM | POA: Diagnosis not present

## 2024-02-12 DIAGNOSIS — R413 Other amnesia: Secondary | ICD-10-CM | POA: Diagnosis not present

## 2024-02-13 ENCOUNTER — Other Ambulatory Visit (HOSPITAL_COMMUNITY): Payer: Self-pay

## 2024-02-14 ENCOUNTER — Other Ambulatory Visit: Payer: Self-pay

## 2024-02-14 ENCOUNTER — Other Ambulatory Visit (HOSPITAL_COMMUNITY): Payer: Self-pay

## 2024-02-16 ENCOUNTER — Other Ambulatory Visit: Payer: Self-pay

## 2024-03-08 ENCOUNTER — Other Ambulatory Visit: Payer: Self-pay

## 2024-03-11 ENCOUNTER — Other Ambulatory Visit (HOSPITAL_COMMUNITY): Payer: Self-pay

## 2024-03-11 ENCOUNTER — Other Ambulatory Visit: Payer: Self-pay

## 2024-03-11 MED ORDER — SUCRALFATE 1 G PO TABS
1.0000 g | ORAL_TABLET | Freq: Two times a day (BID) | ORAL | 2 refills | Status: DC
  Filled 2024-03-11 – 2024-03-12 (×2): qty 60, 30d supply, fill #0
  Filled 2024-05-06 (×2): qty 60, 30d supply, fill #1
  Filled 2024-05-31: qty 60, 30d supply, fill #2

## 2024-03-11 MED ORDER — POTASSIUM CHLORIDE CRYS ER 10 MEQ PO TBCR
20.0000 meq | EXTENDED_RELEASE_TABLET | Freq: Every day | ORAL | 2 refills | Status: DC
  Filled 2024-03-11 – 2024-03-12 (×2): qty 60, 30d supply, fill #0
  Filled 2024-04-08: qty 60, 30d supply, fill #1
  Filled 2024-05-03 – 2024-05-06 (×3): qty 60, 30d supply, fill #2

## 2024-03-12 ENCOUNTER — Other Ambulatory Visit: Payer: Self-pay

## 2024-03-12 ENCOUNTER — Other Ambulatory Visit (HOSPITAL_COMMUNITY): Payer: Self-pay

## 2024-03-12 MED ORDER — AMITRIPTYLINE HCL 10 MG PO TABS
30.0000 mg | ORAL_TABLET | Freq: Every evening | ORAL | 1 refills | Status: DC
  Filled 2024-03-12 (×2): qty 90, 30d supply, fill #0
  Filled 2024-04-08: qty 90, 30d supply, fill #1

## 2024-03-13 ENCOUNTER — Other Ambulatory Visit: Payer: Self-pay

## 2024-04-08 ENCOUNTER — Other Ambulatory Visit: Payer: Self-pay

## 2024-04-29 DIAGNOSIS — F5104 Psychophysiologic insomnia: Secondary | ICD-10-CM | POA: Diagnosis not present

## 2024-04-29 DIAGNOSIS — R413 Other amnesia: Secondary | ICD-10-CM | POA: Diagnosis not present

## 2024-04-29 DIAGNOSIS — Z6826 Body mass index (BMI) 26.0-26.9, adult: Secondary | ICD-10-CM | POA: Diagnosis not present

## 2024-04-29 DIAGNOSIS — F0390 Unspecified dementia without behavioral disturbance: Secondary | ICD-10-CM | POA: Diagnosis not present

## 2024-04-29 DIAGNOSIS — Z1331 Encounter for screening for depression: Secondary | ICD-10-CM | POA: Diagnosis not present

## 2024-04-29 DIAGNOSIS — E876 Hypokalemia: Secondary | ICD-10-CM | POA: Diagnosis not present

## 2024-05-03 ENCOUNTER — Other Ambulatory Visit: Payer: Self-pay

## 2024-05-03 ENCOUNTER — Other Ambulatory Visit (HOSPITAL_COMMUNITY): Payer: Self-pay

## 2024-05-03 MED ORDER — HYDROCHLOROTHIAZIDE 25 MG PO TABS
25.0000 mg | ORAL_TABLET | Freq: Every day | ORAL | 1 refills | Status: DC
  Filled 2024-05-06 (×2): qty 30, 30d supply, fill #0
  Filled 2024-05-31: qty 30, 30d supply, fill #1

## 2024-05-03 MED ORDER — ASPIRIN 81 MG PO TBEC
81.0000 mg | DELAYED_RELEASE_TABLET | Freq: Every day | ORAL | 4 refills | Status: DC
  Filled 2024-05-06 (×2): qty 30, 30d supply, fill #0
  Filled 2024-05-31: qty 30, 30d supply, fill #1

## 2024-05-03 MED ORDER — AMITRIPTYLINE HCL 10 MG PO TABS
10.0000 mg | ORAL_TABLET | Freq: Every day | ORAL | 3 refills | Status: AC
  Filled 2024-05-06 (×2): qty 30, 30d supply, fill #0
  Filled 2024-05-31: qty 30, 30d supply, fill #1
  Filled 2024-06-25: qty 30, 30d supply, fill #2
  Filled 2024-07-25: qty 30, 30d supply, fill #3

## 2024-05-03 MED ORDER — PANTOPRAZOLE SODIUM 40 MG PO TBEC
40.0000 mg | DELAYED_RELEASE_TABLET | Freq: Every day | ORAL | 3 refills | Status: AC
  Filled 2024-05-06 (×2): qty 30, 30d supply, fill #0
  Filled 2024-05-31: qty 30, 30d supply, fill #1
  Filled 2024-06-25: qty 30, 30d supply, fill #2
  Filled 2024-07-25: qty 30, 30d supply, fill #3

## 2024-05-04 ENCOUNTER — Other Ambulatory Visit (HOSPITAL_COMMUNITY): Payer: Self-pay

## 2024-05-06 ENCOUNTER — Other Ambulatory Visit: Payer: Self-pay

## 2024-05-06 ENCOUNTER — Other Ambulatory Visit (HOSPITAL_COMMUNITY): Payer: Self-pay

## 2024-05-07 ENCOUNTER — Other Ambulatory Visit (HOSPITAL_COMMUNITY): Payer: Self-pay

## 2024-05-07 ENCOUNTER — Other Ambulatory Visit: Payer: Self-pay

## 2024-05-08 ENCOUNTER — Other Ambulatory Visit (HOSPITAL_COMMUNITY): Payer: Self-pay

## 2024-05-08 ENCOUNTER — Other Ambulatory Visit: Payer: Self-pay

## 2024-05-09 ENCOUNTER — Other Ambulatory Visit: Payer: Self-pay

## 2024-05-09 ENCOUNTER — Other Ambulatory Visit (HOSPITAL_COMMUNITY): Payer: Self-pay

## 2024-05-13 ENCOUNTER — Other Ambulatory Visit: Payer: Self-pay

## 2024-05-15 ENCOUNTER — Other Ambulatory Visit: Payer: Self-pay

## 2024-05-21 ENCOUNTER — Other Ambulatory Visit: Payer: Self-pay

## 2024-05-21 ENCOUNTER — Other Ambulatory Visit (HOSPITAL_COMMUNITY): Payer: Self-pay

## 2024-05-21 MED ORDER — POTASSIUM CHLORIDE CRYS ER 10 MEQ PO TBCR
20.0000 meq | EXTENDED_RELEASE_TABLET | Freq: Every day | ORAL | 2 refills | Status: AC
  Filled 2024-05-21 – 2024-05-31 (×2): qty 60, 30d supply, fill #0
  Filled 2024-06-25: qty 60, 30d supply, fill #1
  Filled 2024-07-25: qty 60, 30d supply, fill #2

## 2024-05-22 ENCOUNTER — Other Ambulatory Visit: Payer: Self-pay

## 2024-05-31 ENCOUNTER — Other Ambulatory Visit (HOSPITAL_COMMUNITY): Payer: Self-pay

## 2024-05-31 ENCOUNTER — Other Ambulatory Visit: Payer: Self-pay

## 2024-06-01 ENCOUNTER — Other Ambulatory Visit: Payer: Self-pay

## 2024-06-03 ENCOUNTER — Other Ambulatory Visit (HOSPITAL_COMMUNITY): Payer: Self-pay

## 2024-06-03 ENCOUNTER — Other Ambulatory Visit: Payer: Self-pay

## 2024-06-03 DIAGNOSIS — Z6826 Body mass index (BMI) 26.0-26.9, adult: Secondary | ICD-10-CM | POA: Diagnosis not present

## 2024-06-03 DIAGNOSIS — F0390 Unspecified dementia without behavioral disturbance: Secondary | ICD-10-CM | POA: Diagnosis not present

## 2024-06-04 ENCOUNTER — Other Ambulatory Visit (HOSPITAL_COMMUNITY): Payer: Self-pay

## 2024-06-04 ENCOUNTER — Other Ambulatory Visit: Payer: Self-pay

## 2024-06-05 ENCOUNTER — Other Ambulatory Visit (HOSPITAL_COMMUNITY): Payer: Self-pay

## 2024-06-11 ENCOUNTER — Ambulatory Visit: Admitting: Diagnostic Neuroimaging

## 2024-06-11 ENCOUNTER — Encounter: Payer: Self-pay | Admitting: Diagnostic Neuroimaging

## 2024-06-11 ENCOUNTER — Telehealth: Payer: Self-pay | Admitting: Diagnostic Neuroimaging

## 2024-06-11 VITALS — BP 136/76 | HR 70 | Ht 62.0 in | Wt 141.8 lb

## 2024-06-11 DIAGNOSIS — R413 Other amnesia: Secondary | ICD-10-CM

## 2024-06-11 DIAGNOSIS — F03A0 Unspecified dementia, mild, without behavioral disturbance, psychotic disturbance, mood disturbance, and anxiety: Secondary | ICD-10-CM | POA: Diagnosis not present

## 2024-06-11 NOTE — Telephone Encounter (Signed)
 Pt is requesting that her daughter, Rexene be notified of the blood work results

## 2024-06-11 NOTE — Telephone Encounter (Signed)
 Thanks I placed a note for Dr Margaret. Labs are in process.

## 2024-06-11 NOTE — Patient Instructions (Signed)
 MEMORY LOSS (since ~2023; gradual, progressive; mild changes in ADLs; suspect mild dementia) - check B12, TSH, ATN panel - consider memantine  10mg  at bedtime; increase to twice a day after 1-2 weeks - try to stay active physically and get some exercise (at least 15-30 minutes per day) - eat a nutritious diet with lean protein, plants / vegetables, whole grains; avoid ultra-processed foods - increase social activities, brain stimulation, games, puzzles, hobbies, crafts, arts, music; try new activities; keep it fun! - aim for at least 7-8 hours sleep per night (or more) - avoid smoking and alcohol - caution with medications, finances, driving - safety / supervision issues reviewed - caregiver resources provided (including [email or web address])

## 2024-06-11 NOTE — Progress Notes (Signed)
 [facility]  PATIENT: Kelsey Soto DOB: [birth year]  REFERRING CLINICIAN: Clemmie Nest, MD HISTORY FROM: patient  REASON FOR VISIT: new consult   HISTORICAL  CHIEF COMPLAINT:  Chief Complaint  Patient presents with   New Patient (Initial Visit)    Patient in room 6 with family,  Patient is here for short term memory loss, patient states that short term is worse than long term.     HISTORY OF PRESENT ILLNESS:   73 year old female here for evaluation of short-term memory loss and cognitive changes.  Symptoms started 3 to 6 months ago, but possibly as early as 2023.  Having some more difficulty with managing medications and finances.  She lives alone.  She has family that helps her.   REVIEW OF SYSTEMS: Full 14 system review of systems performed and negative with exception of: as per HPI.  ALLERGIES: Allergies[1]  HOME MEDICATIONS: Outpatient Medications Prior to Visit  Medication Sig Dispense Refill   amitriptyline  (ELAVIL ) 10 MG tablet Take 1 tablet (10 mg total) by mouth at bedtime. 30 tablet 3   Apoaequorin (PREVAGEN) 10 MG CAPS Take by mouth 1 day or 1 dose.     aspirin  EC 81 MG tablet Take 81 mg by mouth daily.     hydrochlorothiazide  (HYDRODIURIL ) 25 MG tablet Take 1 tablet (25 mg total) by mouth daily. 30 tablet 1   pantoprazole  (PROTONIX ) 40 MG tablet Take 1 tablet (40 mg total) by mouth daily. 30 tablet 3   potassium chloride  (KLOR-CON  M) 10 MEQ tablet Take 2 tablets (20 mEq total) by mouth daily. 60 tablet 2   amitriptyline  (ELAVIL ) 10 MG tablet Take 40 mg by mouth at bedtime. (Patient taking differently: Take 30 mg by mouth at bedtime.)     amitriptyline  (ELAVIL ) 10 MG tablet Take 3 tablets (30 mg total) by mouth at bedtime. 90 tablet 1   aspirin  EC (ASPIRIN  81) 81 MG tablet Take 1 tablet (81 mg total) by mouth daily. 30 tablet 4   esomeprazole  (NEXIUM ) 40 MG capsule Take 40 mg by mouth daily with supper.     hydrochlorothiazide   (HYDRODIURIL ) 25 MG tablet Take 25 mg by mouth daily.     potassium chloride  (KLOR-CON  M) 10 MEQ tablet Take 20 mEq by mouth daily.     potassium chloride  (KLOR-CON  M) 10 MEQ tablet Take 2 tablets (20 mEq total) by mouth daily. 60 tablet 1   meloxicam (MOBIC) 15 MG tablet Take 15 mg by mouth daily. (Patient not taking: Reported on 06/11/2024)     mometasone (NASONEX) 50 MCG/ACT nasal spray Place 1 spray into the nose daily as needed (allergies). (Patient not taking: Reported on 06/11/2024)     sucralfate  (CARAFATE ) 1 g tablet Take 1 g by mouth 2 (two) times daily. (Patient not taking: Reported on 06/11/2024)     sucralfate  (CARAFATE ) 1 g tablet Take 1 tablet (1 g total) by mouth 2 (two) times daily. (Patient not taking: Reported on 06/11/2024) 60 tablet 2   No facility-administered medications prior to visit.    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   Goiter diffuse    Hyperlipidemia    Hypertension    Hypokalemia    Insomnia    Laryngopharyngeal reflux (LPR) 01/17/2022   Memory loss, short term    Multiple joint  pain    Overactive bladder    Pain of left hand 09/25/2018    PAST SURGICAL HISTORY: Past Surgical History:  Procedure Laterality Date   BALLOON DILATION N/A 03/04/2022   Procedure: BALLOON DILATION;  Surgeon: Elicia Claw, MD;  Location: [facility];  Service: Gastroenterology;  Laterality: N/A;   BIOPSY  03/04/2022   Procedure: BIOPSY;  Surgeon: Elicia Claw, MD;  Location: [facility];  Service: Gastroenterology;;   ESOPHAGOGASTRODUODENOSCOPY (EGD) WITH PROPOFOL  N/A 03/04/2022   Procedure: ESOPHAGOGASTRODUODENOSCOPY (EGD) WITH PROPOFOL ;  Surgeon: Elicia Claw, MD;  Location: [facility];  Service: Gastroenterology;  Laterality: N/A;   TOTAL VAGINAL HYSTERECTOMY       FAMILY HISTORY: Family History  Problem Relation Age of Onset   CVA Mother    CVA Father    Memory loss Neg Hx     SOCIAL HISTORY: Social History   Socioeconomic History   Marital status: Married    Spouse name: Not on file   Number of children: Not on file   Years of education: Not on file   Highest education level: Not on file  Occupational History   Not on file  Tobacco Use   Smoking status: Never   Smokeless tobacco: Never  Vaping Use   Vaping status: Never Used  Substance and Sexual Activity   Alcohol use: Not Currently    Alcohol/week: 1.0 standard drink of alcohol    Types: 1 Cans of beer per week    Comment: when patient was younger, not currently drinking.   Drug use: Never   Sexual activity: Not on file  Other Topics Concern   Not on file  Social History Narrative   Patient does live alone, patient is retired.    Social Drivers of Health   Tobacco Use: Low Risk (06/11/2024)   Patient History    Smoking Tobacco Use: Never    Smokeless Tobacco Use: Never    Passive Exposure: Not on file  Financial Resource Strain: Not on file  Food Insecurity: Not on file  Transportation Needs: Not on file  Physical Activity: Not on file  Stress: Not on file  Social Connections: Not on file  Intimate Partner Violence: Not on file  Depression (EYV7-0): Not on file  Alcohol Screen: Not on file  Housing: Not on file  Utilities: Not on file  Health Literacy: Not on file     PHYSICAL EXAM  GENERAL EXAM/CONSTITUTIONAL: Vitals:  Vitals:   06/11/24 1016  BP: 136/76  Pulse: 70  Weight: 141 lb 12.8 oz (64.3 kg)  Height: 5' 2 (1.575 m)   Body mass index is 25.94 kg/m. Wt Readings from Last 3 Encounters:  06/11/24 141 lb 12.8 oz (64.3 kg)  03/04/22 147 lb (66.7 kg)  12/09/21 147 lb 12.8 oz (67 kg)   Patient is in no distress; well developed, nourished and groomed; neck is supple  CARDIOVASCULAR: Examination of carotid arteries is normal; no carotid  bruits Regular rate and rhythm, no murmurs Examination of peripheral vascular system by observation and palpation is normal  EYES: Ophthalmoscopic exam of optic discs and posterior segments is normal; no papilledema or hemorrhages No results found.  MUSCULOSKELETAL: Gait, strength, tone, movements noted in Neurologic exam below  NEUROLOGIC: MENTAL STATUS:      No data to display            06/11/2024   10:49 AM  Montreal Cognitive Assessment   Visuospatial/ Executive (0/5) 3  Naming (0/3) 3  Attention: Read list of  digits (0/2) 2  Attention: Read list of letters (0/1) 1  Attention: Serial 7 subtraction starting at 100 (0/3) 2  Language: Repeat phrase (0/2) 1  Language : Fluency (0/1) 0  Abstraction (0/2) 2  Delayed Recall (0/5) 0  Orientation (0/6) 5  Total 19  Adjusted Score (based on education) 19   awake, alert, oriented to person, place and time recent and remote memory intact normal attention and concentration language fluent, comprehension intact, naming intact fund of knowledge appropriate SLIGHTLY REPETITIVE   CRANIAL NERVE:  2nd - no papilledema on fundoscopic exam 2nd, 3rd, 4th, 6th - pupils equal and reactive to light, visual fields full to confrontation, extraocular muscles intact, no nystagmus 5th - facial sensation symmetric 7th - facial strength symmetric 8th - hearing intact 9th - palate elevates symmetrically, uvula midline 11th - shoulder shrug symmetric 12th - tongue protrusion midline  MOTOR:  normal bulk and tone, full strength in the BUE, BLE  SENSORY:  normal and symmetric to light touch, temperature, vibration  COORDINATION:  finger-nose-finger, fine finger movements normal  REFLEXES:  deep tendon reflexes present and symmetric  GAIT/STATION:  narrow based gait     DIAGNOSTIC DATA (LABS, IMAGING, TESTING) - I reviewed patient records, labs, notes, testing and imaging myself where available.  Lab Results  Component  Value Date   WBC 4.8 11/13/2022   HGB 12.8 11/13/2022   HCT 39.8 11/13/2022   MCV 88.6 11/13/2022   PLT 179 11/13/2022      Component Value Date/Time   NA 140 11/13/2022 0600   K 3.0 (L) 11/13/2022 0600   CL 107 11/13/2022 0600   CO2 25 11/13/2022 0600   GLUCOSE 92 11/13/2022 0600   BUN 15 11/13/2022 0600   CREATININE 0.94 11/13/2022 0600   CALCIUM 8.9 11/13/2022 0600   GFRNONAA >60 11/13/2022 0600   No results found for: CHOL, HDL, LDLCALC, LDLDIRECT, TRIG, CHOLHDL No results found for: YHAJ8R No results found for: VITAMINB12 No results found for: TSH  10/27/21 CT head [indication: memory loss; I reviewed images myself and agree with interpretation. -VRP]  No acute intracranial abnormality. No significant parenchymal volume loss or white matter disease.  01/17/23 MRI brain / MRA head - no acute findings    ASSESSMENT AND PLAN  73 y.o. year old female here with:   Dx:  1. Memory loss   2. Mild dementia without behavioral disturbance, psychotic disturbance, mood disturbance, or anxiety, unspecified dementia type (HCC)      PLAN:  MEMORY LOSS (since ~2023; gradual, progressive; mild changes in ADLs; suspect mild dementia) - check B12, TSH, ATN panel - consider memantine  10mg  at bedtime; increase to twice a day after 1-2 weeks - try to stay active physically and get some exercise (at least 15-30 minutes per day) - eat a nutritious diet with lean protein, plants / vegetables, whole grains; avoid ultra-processed foods - increase social activities, brain stimulation, games, puzzles, hobbies, crafts, arts, music; try new activities; keep it fun! - aim for at least 7-8 hours sleep per night (or more) - avoid smoking and alcohol - caution with medications, finances, driving - safety / supervision issues reviewed - caregiver resources provided (including [email or web address])  Orders Placed This Encounter  Procedures   Vitamin B12   TSH Rfx on Abnormal to Free T4    ATN PROFILE   Return for pending if symptoms worsen or fail to improve, pending test results.    EDUARD FABIENE HANLON, MD 06/11/2024, 11:29 AM Certified in  Neurology, Neurophysiology and Neuroimaging  [facility] [address] [place], [place] [address] [phone]     [1] No Known Allergies

## 2024-06-12 LAB — TSH RFX ON ABNORMAL TO FREE T4: TSH: 1.54 u[IU]/mL (ref 0.450–4.500)

## 2024-06-13 ENCOUNTER — Other Ambulatory Visit (HOSPITAL_COMMUNITY): Payer: Self-pay

## 2024-06-13 ENCOUNTER — Telehealth: Payer: Self-pay | Admitting: Diagnostic Neuroimaging

## 2024-06-13 ENCOUNTER — Other Ambulatory Visit: Payer: Self-pay

## 2024-06-13 MED ORDER — MEMANTINE HCL 10 MG PO TABS
10.0000 mg | ORAL_TABLET | Freq: Every evening | ORAL | 5 refills | Status: DC
  Filled 2024-06-13: qty 60, 60d supply, fill #0

## 2024-06-13 NOTE — Telephone Encounter (Signed)
 Patient called and would like to try the  memantine . Per 06/11/24 office visit patient is to take memantine10mg  at bedtime; increase to twice a day after 1-2 weeks

## 2024-06-13 NOTE — Telephone Encounter (Signed)
 Pt is needing the memantine  10mg  that was discussed in her last appt to be called in to the [facility]. Pt has decided to go ahead and try the medication.

## 2024-06-14 ENCOUNTER — Telehealth: Payer: Self-pay | Admitting: Diagnostic Neuroimaging

## 2024-06-14 MED ORDER — MEMANTINE HCL 10 MG PO TABS: 10.0000 mg | ORAL_TABLET | Freq: Every evening | ORAL | 5 refills | Status: AC

## 2024-06-14 NOTE — Telephone Encounter (Signed)
 Pt called stating that she will be going out of town tonight at [facility] and she is requesting her memantine  (NAMENDA ) 10 MG tablet to be sent to the [facility] in [place] before she leaves town [employer] [place]. Please advise.

## 2024-06-14 NOTE — Telephone Encounter (Signed)
 Done

## 2024-06-15 LAB — ATN PROFILE
A -- Beta-amyloid 42/40 Ratio: 0.11
Beta-amyloid 40: 147.04 pg/mL
Beta-amyloid 42: 16.15 pg/mL
N -- NfL, Plasma: 2.93 pg/mL (ref 0.00–6.04)
T -- p-tau181: 1.18 pg/mL — AB (ref 0.00–0.97)

## 2024-06-15 LAB — VITAMIN B12: Vitamin B-12: 894 pg/mL (ref 232–1245)

## 2024-06-25 ENCOUNTER — Other Ambulatory Visit: Payer: Self-pay

## 2024-06-25 MED ORDER — HYDROCHLOROTHIAZIDE 25 MG PO TABS
25.0000 mg | ORAL_TABLET | Freq: Every day | ORAL | 2 refills | Status: AC
  Filled 2024-06-25: qty 30, 30d supply, fill #0
  Filled 2024-07-25: qty 30, 30d supply, fill #1

## 2024-07-23 ENCOUNTER — Ambulatory Visit: Payer: Self-pay | Admitting: Diagnostic Neuroimaging

## 2024-07-24 ENCOUNTER — Other Ambulatory Visit (HOSPITAL_COMMUNITY): Payer: Self-pay

## 2024-07-25 ENCOUNTER — Other Ambulatory Visit: Payer: Self-pay

## 2024-07-25 ENCOUNTER — Other Ambulatory Visit (HOSPITAL_COMMUNITY): Payer: Self-pay

## 2024-07-29 ENCOUNTER — Other Ambulatory Visit (HOSPITAL_BASED_OUTPATIENT_CLINIC_OR_DEPARTMENT_OTHER): Payer: Self-pay

## 2024-07-29 ENCOUNTER — Other Ambulatory Visit: Payer: Self-pay

## 2024-07-29 ENCOUNTER — Other Ambulatory Visit (HOSPITAL_COMMUNITY): Payer: Self-pay

## 2024-07-29 ENCOUNTER — Telehealth (HOSPITAL_BASED_OUTPATIENT_CLINIC_OR_DEPARTMENT_OTHER): Payer: Self-pay | Admitting: Family Medicine

## 2024-07-29 NOTE — Telephone Encounter (Signed)
"   Chief complaint: Anxiety and panic attack  Caller states she gets medication delivered and only has one pill left. Please call patient. PCP is [facility].  Physician is with [facility] and not [facility].  Called patient to inform her to call her office. "

## 2024-07-29 NOTE — Telephone Encounter (Signed)
 Left voicemail for patient to reach out to [facility] office for med refill

## 2024-07-30 ENCOUNTER — Other Ambulatory Visit (HOSPITAL_COMMUNITY): Payer: Self-pay

## 2024-07-30 ENCOUNTER — Other Ambulatory Visit: Payer: Self-pay

## 2024-07-30 NOTE — Telephone Encounter (Signed)
 Pt daughter called to request to speak to Nurse about PT Labs and see if MD would want Pt to schedule  follow up
